# Patient Record
Sex: Female | Born: 1965 | Race: White | Hispanic: No | Marital: Married | Smoking: Never smoker
Health system: Southern US, Community
[De-identification: ages and names within clinical notes are randomized; demographics above are authoritative.]

## PROBLEM LIST (undated history)

## (undated) DIAGNOSIS — Z8619 Personal history of other infectious and parasitic diseases: Secondary | ICD-10-CM

## (undated) DIAGNOSIS — E213 Hyperparathyroidism, unspecified: Secondary | ICD-10-CM

## (undated) DIAGNOSIS — Z9289 Personal history of other medical treatment: Secondary | ICD-10-CM

## (undated) DIAGNOSIS — N200 Calculus of kidney: Secondary | ICD-10-CM

## (undated) DIAGNOSIS — E785 Hyperlipidemia, unspecified: Secondary | ICD-10-CM

## (undated) DIAGNOSIS — T7840XA Allergy, unspecified, initial encounter: Secondary | ICD-10-CM

## (undated) DIAGNOSIS — F329 Major depressive disorder, single episode, unspecified: Secondary | ICD-10-CM

## (undated) DIAGNOSIS — G43909 Migraine, unspecified, not intractable, without status migrainosus: Secondary | ICD-10-CM

## (undated) DIAGNOSIS — N390 Urinary tract infection, site not specified: Secondary | ICD-10-CM

## (undated) DIAGNOSIS — F32A Depression, unspecified: Secondary | ICD-10-CM

## (undated) HISTORY — PX: TONSILLECTOMY: SUR1361

## (undated) HISTORY — PX: KIDNEY STONE SURGERY: SHX686

## (undated) HISTORY — DX: Calculus of kidney: N20.0

## (undated) HISTORY — PX: OTHER SURGICAL HISTORY: SHX169

## (undated) HISTORY — DX: Major depressive disorder, single episode, unspecified: F32.9

## (undated) HISTORY — DX: Personal history of other medical treatment: Z92.89

## (undated) HISTORY — PX: CHOLECYSTECTOMY: SHX55

## (undated) HISTORY — DX: Hyperlipidemia, unspecified: E78.5

## (undated) HISTORY — DX: Hyperparathyroidism, unspecified: E21.3

## (undated) HISTORY — PX: ABDOMINAL HYSTERECTOMY: SHX81

## (undated) HISTORY — PX: MENISCUS REPAIR: SHX5179

## (undated) HISTORY — DX: Allergy, unspecified, initial encounter: T78.40XA

## (undated) HISTORY — DX: Migraine, unspecified, not intractable, without status migrainosus: G43.909

## (undated) HISTORY — DX: Depression, unspecified: F32.A

## (undated) HISTORY — PX: PARATHYROIDECTOMY: SHX19

## (undated) HISTORY — DX: Personal history of other infectious and parasitic diseases: Z86.19

## (undated) HISTORY — DX: Urinary tract infection, site not specified: N39.0

---

## 2007-03-16 ENCOUNTER — Ambulatory Visit: Payer: Self-pay | Admitting: Family Medicine

## 2007-03-19 ENCOUNTER — Emergency Department: Payer: Self-pay | Admitting: Emergency Medicine

## 2007-05-20 ENCOUNTER — Ambulatory Visit: Payer: Self-pay | Admitting: Obstetrics and Gynecology

## 2008-01-13 ENCOUNTER — Ambulatory Visit: Payer: Self-pay | Admitting: Obstetrics and Gynecology

## 2008-01-18 ENCOUNTER — Inpatient Hospital Stay: Payer: Self-pay | Admitting: Obstetrics and Gynecology

## 2008-01-24 ENCOUNTER — Other Ambulatory Visit: Payer: Self-pay

## 2008-01-24 ENCOUNTER — Emergency Department: Payer: Self-pay | Admitting: Emergency Medicine

## 2008-02-03 ENCOUNTER — Inpatient Hospital Stay: Payer: Self-pay | Admitting: Obstetrics and Gynecology

## 2009-11-22 ENCOUNTER — Ambulatory Visit: Payer: Self-pay | Admitting: Sports Medicine

## 2013-10-11 ENCOUNTER — Emergency Department: Payer: Self-pay | Admitting: Emergency Medicine

## 2013-10-11 LAB — URINALYSIS, COMPLETE
Bilirubin,UR: NEGATIVE
GLUCOSE, UR: NEGATIVE mg/dL (ref 0–75)
Leukocyte Esterase: NEGATIVE
Nitrite: NEGATIVE
Ph: 7 (ref 4.5–8.0)
Protein: NEGATIVE
RBC,UR: 537 /HPF (ref 0–5)
Specific Gravity: 1.016 (ref 1.003–1.030)
Squamous Epithelial: <1
WBC UR: 4 /HPF (ref 0–5)

## 2013-10-11 LAB — CBC WITH DIFFERENTIAL/PLATELET
BASOS ABS: 0.1 10*3/uL (ref 0.0–0.1)
Basophil %: 0.4 %
Eosinophil #: 0.1 10*3/uL (ref 0.0–0.7)
Eosinophil %: 0.7 %
HCT: 39.2 % (ref 35.0–47.0)
HGB: 13.1 g/dL (ref 12.0–16.0)
LYMPHS PCT: 14.3 %
Lymphocyte #: 2 10*3/uL (ref 1.0–3.6)
MCH: 30.4 pg (ref 26.0–34.0)
MCHC: 33.3 g/dL (ref 32.0–36.0)
MCV: 91 fL (ref 80–100)
MONO ABS: 0.7 x10 3/mm (ref 0.2–0.9)
MONOS PCT: 5 %
Neutrophil #: 11.3 10*3/uL — ABNORMAL HIGH (ref 1.4–6.5)
Neutrophil %: 79.6 %
PLATELETS: 232 10*3/uL (ref 150–440)
RBC: 4.3 10*6/uL (ref 3.80–5.20)
RDW: 13.5 % (ref 11.5–14.5)
WBC: 14.2 10*3/uL — ABNORMAL HIGH (ref 3.6–11.0)

## 2013-10-11 LAB — COMPREHENSIVE METABOLIC PANEL
ALK PHOS: 111 U/L
Albumin: 4.3 g/dL (ref 3.4–5.0)
Anion Gap: 5 — ABNORMAL LOW (ref 7–16)
BUN: 32 mg/dL — AB (ref 7–18)
Bilirubin,Total: 0.4 mg/dL (ref 0.2–1.0)
CALCIUM: 9.7 mg/dL (ref 8.5–10.1)
CHLORIDE: 105 mmol/L (ref 98–107)
CO2: 30 mmol/L (ref 21–32)
Creatinine: 1.2 mg/dL (ref 0.60–1.30)
EGFR (Non-African Amer.): 54 — ABNORMAL LOW
Glucose: 143 mg/dL — ABNORMAL HIGH (ref 65–99)
OSMOLALITY: 289 (ref 275–301)
Potassium: 4.1 mmol/L (ref 3.5–5.1)
SGOT(AST): 29 U/L (ref 15–37)
SGPT (ALT): 31 U/L (ref 12–78)
Sodium: 140 mmol/L (ref 136–145)
TOTAL PROTEIN: 7.3 g/dL (ref 6.4–8.2)

## 2013-10-18 ENCOUNTER — Ambulatory Visit: Payer: Self-pay | Admitting: Urology

## 2013-11-25 ENCOUNTER — Other Ambulatory Visit: Payer: Self-pay | Admitting: Family Medicine

## 2013-11-25 DIAGNOSIS — N83209 Unspecified ovarian cyst, unspecified side: Secondary | ICD-10-CM

## 2013-12-08 ENCOUNTER — Other Ambulatory Visit: Payer: Self-pay | Admitting: Family Medicine

## 2013-12-08 DIAGNOSIS — N83209 Unspecified ovarian cyst, unspecified side: Secondary | ICD-10-CM

## 2013-12-29 ENCOUNTER — Other Ambulatory Visit: Payer: Self-pay | Admitting: Family Medicine

## 2013-12-29 ENCOUNTER — Ambulatory Visit
Admission: RE | Admit: 2013-12-29 | Discharge: 2013-12-29 | Disposition: A | Discharge status: Home / Self Care | Payer: 59 | Source: Ambulatory Visit | Attending: Family Medicine | Admitting: Family Medicine

## 2013-12-29 DIAGNOSIS — N6310 Unspecified lump in the right breast, unspecified quadrant: Principal | ICD-10-CM

## 2013-12-29 DIAGNOSIS — N83209 Unspecified ovarian cyst, unspecified side: Secondary | ICD-10-CM

## 2013-12-29 DIAGNOSIS — N6315 Unspecified lump in the right breast, overlapping quadrants: Secondary | ICD-10-CM

## 2014-01-07 ENCOUNTER — Other Ambulatory Visit: Payer: Self-pay

## 2014-03-10 ENCOUNTER — Other Ambulatory Visit: Payer: Self-pay | Admitting: Family Medicine

## 2014-03-10 DIAGNOSIS — N83209 Unspecified ovarian cyst, unspecified side: Secondary | ICD-10-CM

## 2014-07-07 ENCOUNTER — Emergency Department: Payer: Self-pay | Admitting: Emergency Medicine

## 2014-09-12 ENCOUNTER — Ambulatory Visit
Admit: 2014-09-12 | Disposition: A | Discharge status: Home / Self Care | Payer: Self-pay | Attending: Specialist | Admitting: Specialist

## 2014-10-09 NOTE — Op Note (Signed)
PATIENT NAME:  [name], Melanie Ballard MR#:  [id number] DATE OF BIRTH:  [birth year]  DATE OF PROCEDURE:  09/12/2014  PREOPERATIVE DIAGNOSIS: Displaced bucket handle tear right medial meniscus.   POSTOPERATIVE DIAGNOSIS: Displaced bucket handle tear right medial meniscus.   OPERATION: Arthroscopic partial right medial meniscectomy.   SURGEON: Valinda HoarHoward E. Aneudy Champlain, MD.   ANESTHESIA: General LMA.   COMPLICATIONS: None.   DRAINS: None.   ESTIMATED BLOOD LOSS: Minimal.   REPLACEMENT: None.   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 The anterior and posterior cruciates were intact. The lateral compartment was normal. The patellofemoral articulation was normal. There was moderate synovitis. There were no loose bodies.   OPERATIVE PROCEDURE: The patient was brought to the operating room where she underwent satisfactory general LMA anesthesia in the supine position. Arthroscopic surgery was carried out through standard portals. The above findings were encountered. A medial portal was made and a probe inserted, and the bucket handle tear reduced. It was extremely ragged and complex and did not appear to be at all repairable. The handle was divided in 2 and a motorized resector used to remove the anterior portion and the posterior section. The ArthroCare wand was used to assist in this. Basket forceps were used as well. The torn portion of the meniscus was removed in its entirety. This left a moderate rim. This was ragged and somewhat avascular-looking. It was trimmed up with the motorized resector to smooth it off. Articular surface of the femur had some dents in it from the meniscus anteriorly. Otherwise there was minimal degenerative change. After all debridement was carried out the joint was thoroughly irrigated free of debris and instruments removed. Stab wounds were closed with 3-0 nylon suture and 0.5% Marcaine  with epinephrine and morphine were placed in the joint. Dry sterile dressing was applied. Tourniquet was not used. The patient was awakened and taken to recovery in good condition.    ____________________________ Valinda HoarHoward E. Brenda Cowher, MD hem:bu Ballard: 09/12/2014 15:52:03 ET T: 09/12/2014 17:56:38 ET JOB#: [id number]  cc: Valinda HoarHoward E. Veronica Fretz, MD, <Dictator> Valinda HoarHOWARD E Lavana Huckeba MD ELECTRONICALLY SIGNED 09/14/2014 9:52

## 2015-02-02 ENCOUNTER — Other Ambulatory Visit: Payer: Self-pay | Admitting: Family Medicine

## 2015-02-02 DIAGNOSIS — N6310 Unspecified lump in the right breast, unspecified quadrant: Principal | ICD-10-CM

## 2015-02-02 DIAGNOSIS — N6315 Unspecified lump in the right breast, overlapping quadrants: Secondary | ICD-10-CM

## 2015-02-08 ENCOUNTER — Ambulatory Visit
Admission: RE | Admit: 2015-02-08 | Discharge: 2015-02-08 | Disposition: A | Discharge status: Home / Self Care | Payer: 59 | Source: Ambulatory Visit | Attending: Family Medicine | Admitting: Family Medicine

## 2015-02-08 ENCOUNTER — Other Ambulatory Visit: Payer: Self-pay | Admitting: Family Medicine

## 2015-02-08 DIAGNOSIS — N6310 Unspecified lump in the right breast, unspecified quadrant: Principal | ICD-10-CM

## 2015-02-08 DIAGNOSIS — N6315 Unspecified lump in the right breast, overlapping quadrants: Secondary | ICD-10-CM

## 2016-07-08 ENCOUNTER — Other Ambulatory Visit: Payer: Self-pay | Admitting: Family Medicine

## 2016-07-08 DIAGNOSIS — Z1231 Encounter for screening mammogram for malignant neoplasm of breast: Secondary | ICD-10-CM

## 2016-07-18 ENCOUNTER — Ambulatory Visit
Admission: RE | Admit: 2016-07-18 | Discharge: 2016-07-18 | Disposition: A | Discharge status: Home / Self Care | Payer: 59 | Source: Ambulatory Visit | Attending: Family Medicine | Admitting: Family Medicine

## 2016-07-18 DIAGNOSIS — Z1231 Encounter for screening mammogram for malignant neoplasm of breast: Secondary | ICD-10-CM

## 2016-07-29 LAB — HM COLONOSCOPY

## 2016-09-06 ENCOUNTER — Encounter: Payer: Self-pay | Admitting: Emergency Medicine

## 2016-09-06 ENCOUNTER — Emergency Department
Admission: EM | Admit: 2016-09-06 | Discharge: 2016-09-06 | Disposition: A | Discharge status: Home / Self Care | Payer: 59 | Attending: Emergency Medicine | Admitting: Emergency Medicine

## 2016-09-06 ENCOUNTER — Emergency Department: Payer: 59

## 2016-09-06 DIAGNOSIS — R109 Unspecified abdominal pain: Secondary | ICD-10-CM | POA: Diagnosis present

## 2016-09-06 DIAGNOSIS — N23 Unspecified renal colic: Secondary | ICD-10-CM | POA: Diagnosis not present

## 2016-09-06 DIAGNOSIS — R52 Pain, unspecified: Secondary | ICD-10-CM

## 2016-09-06 LAB — URINALYSIS, COMPLETE (UACMP) WITH MICROSCOPIC
BILIRUBIN URINE: NEGATIVE
GLUCOSE, UA: NEGATIVE mg/dL
Ketones, ur: 5 mg/dL — AB
LEUKOCYTES UA: NEGATIVE
NITRITE: NEGATIVE
PH: 6 (ref 5.0–8.0)
Protein, ur: 30 mg/dL — AB
SPECIFIC GRAVITY, URINE: 1.019 (ref 1.005–1.030)
Squamous Epithelial / LPF: NONE SEEN

## 2016-09-06 LAB — CBC WITH DIFFERENTIAL/PLATELET
Basophils Absolute: 0 10*3/uL (ref 0–0.1)
Basophils Relative: 0 %
EOS ABS: 0 10*3/uL (ref 0–0.7)
Eosinophils Relative: 0 %
HCT: 39.6 % (ref 35.0–47.0)
HEMOGLOBIN: 14 g/dL (ref 12.0–16.0)
LYMPHS ABS: 1.5 10*3/uL (ref 1.0–3.6)
Lymphocytes Relative: 13 %
MCH: 31.2 pg (ref 26.0–34.0)
MCHC: 35.3 g/dL (ref 32.0–36.0)
MCV: 88.5 fL (ref 80.0–100.0)
MONO ABS: 0.9 10*3/uL (ref 0.2–0.9)
MONOS PCT: 8 %
NEUTROS PCT: 79 %
Neutro Abs: 9 10*3/uL — ABNORMAL HIGH (ref 1.4–6.5)
Platelets: 254 10*3/uL (ref 150–440)
RBC: 4.48 MIL/uL (ref 3.80–5.20)
RDW: 13.4 % (ref 11.5–14.5)
WBC: 11.4 10*3/uL — ABNORMAL HIGH (ref 3.6–11.0)

## 2016-09-06 LAB — COMPREHENSIVE METABOLIC PANEL
ALT: 29 U/L (ref 14–54)
ANION GAP: 10 (ref 5–15)
AST: 35 U/L (ref 15–41)
Albumin: 4.9 g/dL (ref 3.5–5.0)
Alkaline Phosphatase: 137 U/L — ABNORMAL HIGH (ref 38–126)
BILIRUBIN TOTAL: 1 mg/dL (ref 0.3–1.2)
BUN: 29 mg/dL — ABNORMAL HIGH (ref 6–20)
CHLORIDE: 100 mmol/L — AB (ref 101–111)
CO2: 28 mmol/L (ref 22–32)
Calcium: 10.9 mg/dL — ABNORMAL HIGH (ref 8.9–10.3)
Creatinine, Ser: 1.5 mg/dL — ABNORMAL HIGH (ref 0.44–1.00)
GFR calc Af Amer: 46 mL/min — ABNORMAL LOW (ref >60)
GFR, EST NON AFRICAN AMERICAN: 40 mL/min — AB (ref >60)
Glucose, Bld: 124 mg/dL — ABNORMAL HIGH (ref 65–99)
POTASSIUM: 4.2 mmol/L (ref 3.5–5.1)
Sodium: 138 mmol/L (ref 135–145)
TOTAL PROTEIN: 8.1 g/dL (ref 6.5–8.1)

## 2016-09-06 LAB — LIPASE, BLOOD: LIPASE: 11 U/L (ref 11–51)

## 2016-09-06 MED ORDER — MORPHINE SULFATE (PF) 4 MG/ML IV SOLN
4.0000 mg | Freq: Once | INTRAVENOUS | Status: AC
  Administered 2016-09-06: 4 mg via INTRAVENOUS
  Filled 2016-09-06: qty 1

## 2016-09-06 MED ORDER — HYDROMORPHONE HCL 1 MG/ML IJ SOLN
0.5000 mg | Freq: Once | INTRAMUSCULAR | Status: AC
  Administered 2016-09-06: 0.5 mg via INTRAVENOUS
  Filled 2016-09-06: qty 1

## 2016-09-06 MED ORDER — CIPROFLOXACIN HCL 500 MG PO TABS: 500.0000 mg | ORAL_TABLET | Freq: Two times a day (BID) | ORAL | 0 refills | Status: DC

## 2016-09-06 MED ORDER — OXYCODONE-ACETAMINOPHEN 5-325 MG PO TABS: 1.0000 | ORAL_TABLET | Freq: Four times a day (QID) | ORAL | 0 refills | Status: DC | PRN

## 2016-09-06 MED ORDER — ONDANSETRON HCL 4 MG/2ML IJ SOLN
4.0000 mg | Freq: Once | INTRAMUSCULAR | Status: AC
  Administered 2016-09-06: 4 mg via INTRAVENOUS
  Filled 2016-09-06: qty 2

## 2016-09-06 NOTE — ED Provider Notes (Signed)
[facility] Emergency Department Provider Note   ____________________________________________   First MD Initiated Contact with Patient 09/06/16 [phone]     (approximate)  I have reviewed the triage vital signs and the nursing notes.   HISTORY  Chief Complaint Flank Pain and Abdominal Pain    HPI Melanie Ballard is a 51 y.o. female who reports she was at her daughter's house yesterday and had kind of sudden onset of left flank pain radiating down toward the left lower quadrant of her abdomen. It was severe and she was unable to lay down. She's had some nausea and vomiting but no diarrhea. She's had no increased pain with deep breathing or the bumps in the road. It is no dysuria.No fever.no diarrhea.  History reviewed. No pertinent past medical history.  There are no active problems to display for this patient.   Past Surgical History:  Procedure Laterality Date  . ABDOMINAL HYSTERECTOMY    . CESAREAN SECTION    . CHOLECYSTECTOMY    . TONSILLECTOMY      Prior to Admission medications   Medication Sig Start Date End Date Taking? Authorizing Provider  ciprofloxacin (CIPRO) 500 MG tablet Take 1 tablet (500 mg total) by mouth 2 (two) times daily. 09/06/16 09/16/16  Arnaldo Natal, MD  oxyCODONE-acetaminophen (ROXICET) 5-325 MG tablet Take 1 tablet by mouth every 6 (six) hours as needed. 09/06/16 09/06/17  Arnaldo Natal, MD    Allergies Penicillins  No family history on file.  Social History Social History  Substance Use Topics  . Smoking status: Never Smoker  . Smokeless tobacco: Not on file  . Alcohol use Not on file    Review of Systems Constitutional: No fever/chills Eyes: No visual changes. ENT: No sore throat. Cardiovascular: Denies chest pain. Respiratory: Denies shortness of breath. Gastrointestinal:ee history of present illness Genitourinary: Negative for dysuria. Musculoskeletal: Negative for back pain. Skin: Negative for  rash. Neurological: Negative for headaches, focal weakness or numbness.  10-point ROS otherwise negative.  ____________________________________________   PHYSICAL EXAM:  VITAL SIGNS: ED Triage Vitals  Enc Vitals Group     BP 09/06/16 0909 (!) 151/100     Pulse Rate 09/06/16 0909 95     Resp 09/06/16 0909 18     Temp 09/06/16 0909 98.4 F (36.9 C)     Temp Source 09/06/16 0909 Oral     SpO2 09/06/16 0909 100 %     Weight 09/06/16 0909 170 lb (77.1 kg)     Height 09/06/16 0909  (1.626 m)     Head Circumference --      Peak Flow --      Pain Score 09/06/16 0907 6     Pain Loc --      Pain Edu? --      Excl. in GC? --    Constitutional: Alert and oriented. Well appearing and in no acute distress. Eyes: Conjunctivae are normal. PERRL. EOMI. Head: Atraumatic. Nose: No congestion/rhinnorhea. Mouth/Throat: Mucous membranes are moist.  Oropharynx non-erythematous. Neck: No stridor.  Cardiovascular: Normal rate, regular rhythm. Grossly normal heart sounds.  Good peripheral circulation. Respiratory: Normal respiratory effort.  No retractions. Lungs CTAB. Gastrointestinal: Soft tender to deep palpation left lower quadrant. No distention. No abdominal bruits. No CVA tenderness! Musculoskeletal: No lower extremity tenderness nor edema.  No joint effusions. Neurologic:  Normal speech and language. No gross focal neurologic deficits are appreciated. No gait instability. Skin:  Skin is warm, dry and intact. No rash  noted.   ____________________________________________   LABS (all labs ordered are listed, but only abnormal results are displayed)  Labs Reviewed  COMPREHENSIVE METABOLIC PANEL - Abnormal; Notable for the following:       Result Value   Chloride 100 (*)    Glucose, Bld 124 (*)    BUN 29 (*)    Creatinine, Ser 1.50 (*)    Calcium 10.9 (*)    Alkaline Phosphatase 137 (*)    GFR calc non Af Amer 40 (*)    GFR calc Af Amer 46 (*)    All other components within  normal limits  URINALYSIS, COMPLETE (UACMP) WITH MICROSCOPIC - Abnormal; Notable for the following:    Color, Urine YELLOW (*)    APPearance CLOUDY (*)    Hgb urine dipstick LARGE (*)    Ketones, ur 5 (*)    Protein, ur 30 (*)    Bacteria, UA RARE (*)    All other components within normal limits  CBC WITH DIFFERENTIAL/PLATELET - Abnormal; Notable for the following:    WBC 11.4 (*)    Neutro Abs 9.0 (*)    All other components within normal limits  LIPASE, BLOOD   ____________________________________________  EKG   ____________________________________________  RADIOLOGY   ____________________________________________   PROCEDURES  Procedure(s) performed:  Procedures  Critical Care performed:   ____________________________________________   INITIAL IMPRESSION / ASSESSMENT AND PLAN / ED COURSE  Pertinent labs & imaging results that were available during my care of the patient were reviewed by me and considered in my medical decision making (see chart for details).        ____________________________________________   FINAL CLINICAL IMPRESSION(S) / ED DIAGNOSES  Final diagnoses:  Pain  Renal colic      NEW MEDICATIONS STARTED DURING THIS VISIT:  Discharge Medication List as of 09/06/2016 11:33 AM    START taking these medications   Details  ciprofloxacin (CIPRO) 500 MG tablet Take 1 tablet (500 mg total) by mouth 2 (two) times daily., Starting Fri 09/06/2016, Until Mon 09/16/2016, Print    oxyCODONE-acetaminophen (ROXICET) 5-325 MG tablet Take 1 tablet by mouth every 6 (six) hours as needed., Starting Fri 09/06/2016, Until Sat 09/06/2017, Print         Note:  This document was prepared using Dragon voice recognition software and may include unintentional dictation errors.    Arnaldo Natal, MD 09/06/16 [phone]

## 2016-09-06 NOTE — ED Triage Notes (Signed)
L flank pain since yesterday, no history of kidney stones or kidney infection.

## 2016-09-06 NOTE — ED Notes (Signed)
Patient returned from CT

## 2016-09-06 NOTE — ED Notes (Signed)
Patient transported to CT 

## 2016-09-06 NOTE — Discharge Instructions (Signed)
His return for worse pain fever vomiting or feeling sicker. Please follow-up with urologist. Take the Cipro one pill twice a day. This is an antibiotic and will help to keep you from getting any infection. There are some white blood cells and bacteria in the urine. Take the Percocet 1 or 2 pills 4 times a day as needed for pain. Please understand the stone is very large and may not pass. The urologist will help with this.

## 2016-09-10 NOTE — Progress Notes (Signed)
09/11/2016 11:51 AM   Prescott Parma Feb 21, 1966 [id number]  Referring provider: Gweneth Dimitri, MD [address] [place], [place] [address]  Chief Complaint  Patient presents with  . New Patient (Initial Visit)    kidney stone referred by ER    HPI: Patient is a 51 year old Caucasian female who presents/is referred by [facility] ED for nephrolithiasis.  Patient states the onset of the pain was one weeks ago.   It was sharp.  It lasted for several hours.  The pain was located in the left flank and radiated to the left lower abdomen.  The pain was a 10/10.  Nothing made the pain better.   Nothing made the pain worse.  She did have or did not have gross hematuria, fevers or chills, she has had nausea and vomiting.  In the ED, she received morphine, dilaudid and Zofran.  Her UA demonstrated TNTC RBC's and 6-10 WBC's.    Her serum creatinine was 1.50, her prior serum creatinine was 1.20.  Her WBC count was 11.4.    CT Renal stone study performed on 09/06/2016 noted a 7 mm mid left ureteral calculus is associated with left hydronephrosis.  Right nephrolithiasis.  Degenerative disc disease at L4-5.  Chronic changes.  I have independently reviewed the films.    Today, she is still having 10/10 pain.  UA today is unremarkable.    She does not have a prior history of stones.         PMH: Past Medical History:  Diagnosis Date  . Depression   . Kidney stone     Surgical History: Past Surgical History:  Procedure Laterality Date  . ABDOMINAL HYSTERECTOMY    . CESAREAN SECTION    . CHOLECYSTECTOMY    . TONSILLECTOMY      Home Medications:  Allergies as of 09/11/2016      Reactions   Penicillins Shortness Of Breath      Medication List       Accurate as of 09/11/16 11:51 AM. Always use your most recent med list.          buPROPion 200 MG 12 hr tablet Commonly known as:  WELLBUTRIN SR Take 200 mg by mouth.   ciprofloxacin 500 MG tablet Commonly known as:   CIPRO Take 1 tablet (500 mg total) by mouth 2 (two) times daily.   DULOXETINE HCL PO Take 90 mg by mouth.   EQL VITAMIN D3 GUMMIES PO Take by mouth.   oxyCODONE-acetaminophen 5-325 MG tablet Commonly known as:  ROXICET Take 1 tablet by mouth every 6 (six) hours as needed.   tamsulosin 0.4 MG Caps capsule Commonly known as:  FLOMAX Take 1 capsule (0.4 mg total) by mouth daily.   zolpidem 10 MG tablet Commonly known as:  AMBIEN Take 10 mg by mouth.       Allergies:  Allergies  Allergen Reactions  . Penicillins Shortness Of Breath    Family History: Family History  Problem Relation Age of Onset  . Prostate cancer Neg Hx   . Kidney cancer Neg Hx   . Bladder Cancer Neg Hx     Social History:  reports that she has never smoked. She has never used smokeless tobacco. She reports that she drinks alcohol. She reports that she does not use drugs.  ROS: UROLOGY Frequent Urination?: Yes Hard to postpone urination?: Yes Burning/pain with urination?: No Get up at night to urinate?: Yes Leakage of urine?: Yes Urine stream starts and  stops?: No Trouble starting stream?: No Do you have to strain to urinate?: No Blood in urine?: No Urinary tract infection?: No Sexually transmitted disease?: No Injury to kidneys or bladder?: No Painful intercourse?: No Weak stream?: No Currently pregnant?: No Vaginal bleeding?: No Last menstrual period?: n  Gastrointestinal Nausea?: Yes Vomiting?: Yes Indigestion/heartburn?: Yes Diarrhea?: No Constipation?: No  Constitutional Fever: No Night sweats?: Yes Weight loss?: No Fatigue?: Yes  Skin Skin rash/lesions?: No Itching?: No  Eyes Blurred vision?: No Double vision?: No  Ears/Nose/Throat Sore throat?: No Sinus problems?: No  Hematologic/Lymphatic Swollen glands?: No Easy bruising?: No  Cardiovascular Leg swelling?: No Chest pain?: No  Respiratory Cough?: No Shortness of breath?: No  Endocrine Excessive  thirst?: No  Musculoskeletal Back pain?: Yes Joint pain?: No  Neurological Headaches?: No Dizziness?: No  Psychologic Depression?: No Anxiety?: No  Physical Exam: BP (!) 152/83   Pulse 78   Ht  (1.626 m)   Wt 168 lb 9.6 oz (76.5 kg)   BMI 28.94 kg/m   Constitutional: Well nourished. Alert and oriented, No acute distress. HEENT: [place] AT, moist mucus membranes. Trachea midline, no masses. Cardiovascular: No clubbing, cyanosis, or edema. Respiratory: Normal respiratory effort, no increased work of breathing. GI: Abdomen is soft, non tender, non distended, no abdominal masses. Liver and spleen not palpable.  No hernias appreciated.  Stool sample for occult testing is not indicated.   GU: Left CVA tenderness.  No bladder fullness or masses.   Skin: No rashes, bruises or suspicious lesions. Lymph: No cervical or inguinal adenopathy. Neurologic: Grossly intact, no focal deficits, moving all 4 extremities. Psychiatric: Normal mood and affect.  Laboratory Data: Lab Results  Component Value Date   WBC 11.4 (H) 09/06/2016   HGB 14.0 09/06/2016   HCT 39.6 09/06/2016   MCV 88.5 09/06/2016   PLT 254 09/06/2016    Lab Results  Component Value Date   CREATININE 1.50 (H) 09/06/2016     Lab Results  Component Value Date   AST 35 09/06/2016   Lab Results  Component Value Date   ALT 29 09/06/2016     Urinalysis Unremarkable.  See EPIC.    Pertinent Imaging: CLINICAL DATA:  Left abdominal pain  EXAM: CT ABDOMEN AND PELVIS WITHOUT CONTRAST  TECHNIQUE: Multidetector CT imaging of the abdomen and pelvis was performed following the standard protocol without IV contrast.  COMPARISON:  10/11/2013  FINDINGS: Lower chest: No acute abnormality.  Hepatobiliary: Unremarkable liver.  Postcholecystectomy.  Pancreas: Unremarkable  Spleen: Calcified granulomata.  Adrenals/Urinary Tract: Moderate to severe left hydronephrosis. 7 mm mid left ureteral  calculus. Tiny calculus in the upper pole of the right kidney.  Stomach/Bowel: Stomach is unremarkable. Normal appendix. No evidence of small-bowel obstruction.  Vascular/Lymphatic: No abnormal adenopathy. No evidence of aortic aneurysm. Mild atherosclerotic calcifications of the aorta and iliac arteries.  Reproductive: Uterus is absent. Left adnexa is unremarkable. Cystic abnormality in the right adnexa previously measured 5.6 cm, and state measures 3.7 cm. Regression supports benign etiology.  Other: No free-fluid.  Pelvic phleboliths are noted.  Musculoskeletal: No vertebral compression deformity. Broad-based disc herniation at L4-5 possibly causes spinal stenosis.  IMPRESSION: 7 mm mid left ureteral calculus is associated with left hydronephrosis.  Right nephrolithiasis.  Degenerative disc disease at L4-5.  Chronic changes.   Electronically Signed   By: Jolaine Click M.D.   On: 09/06/2016 09:56  Assessment & Plan:    Patient with an obstructing 8 mm left mid ureteral stone will undergo  left ureteroscopy with laser lithotripsy and ureteral stent placement for definitive treatment.    1. Left ureteral stone  - discussed treatment approaches to renal stones including observation, medical expulsive therapy, ureteroscopic stone extraction and shockwave lithotripsy - discussed the relative merits of the techniques including risks, benefits, and predicted efficacy with each techniques - she would like to pursue URS/LL/ureteral stent placement  - schedule left ureteroscopy with laser lithotripsy and ureteral stent placement  - explained to the patient how the procedure is performed and the risks involved  - informed patient that they will have a stent placed during the procedure and will remain in place after the procedure for a short time.   - stent may be removed in the office with a cystoscope or patient may be instructed to remove the stent themselves by the  string  - described "stent pain" as feelings of needing to urinate/overactive bladder and a warm, tingling sensation to intense pain in the affected flank  - residual stones within the kidney or ureter may be present after the procedure and may need to have these addressed at a different encounter  - injury to the ureter is the most common intra-operative risk, it may result in an open procedure to correct the defect  - infection and bleeding are also risks  - explained the risks of general anesthesia, such as: MI, CVA, paralysis, coma and/or death.  - advised to contact our office or seek treatment in the ED if becomes febrile or pain/ vomiting are difficult control in order to arrange for emergent/urgent intervention  2. Left hydronephrosis  - obtain RUS to ensure the hydronephrosis has resolved once she has recovered from procedure  3. Microscopic hematuria  - UA today unremarkable  - continue to monitor the patient's UA after the treatment/passage of the stone to ensure the hematuria has resolved  - if hematuria persists, we will pursue a hematuria workup with CT Urogram and cystoscopy if appropriate.  Return for left URS/LL/ureteral stent placement.  These notes generated with voice recognition software. I apologize for typographical errors.  Michiel Cowboy, PA-C  [facility] [address], [address] [place], [place] [address] [phone]

## 2016-09-11 ENCOUNTER — Ambulatory Visit: Payer: 59 | Admitting: Urology

## 2016-09-11 ENCOUNTER — Encounter: Payer: Self-pay | Admitting: Urology

## 2016-09-11 ENCOUNTER — Other Ambulatory Visit: Payer: Self-pay | Admitting: Radiology

## 2016-09-11 VITALS — BP 152/83 | HR 78 | Ht 64.0 in | Wt 168.6 lb

## 2016-09-11 DIAGNOSIS — N132 Hydronephrosis with renal and ureteral calculous obstruction: Secondary | ICD-10-CM | POA: Diagnosis not present

## 2016-09-11 DIAGNOSIS — N201 Calculus of ureter: Secondary | ICD-10-CM

## 2016-09-11 DIAGNOSIS — R3129 Other microscopic hematuria: Secondary | ICD-10-CM

## 2016-09-11 LAB — URINALYSIS, COMPLETE
BILIRUBIN UA: NEGATIVE
GLUCOSE, UA: NEGATIVE
KETONES UA: NEGATIVE
Nitrite, UA: NEGATIVE
PROTEIN UA: NEGATIVE
SPEC GRAV UA: 1.025 (ref 1.005–1.030)
UUROB: 0.2 mg/dL (ref 0.2–1.0)
pH, UA: 5.5 (ref 5.0–7.5)

## 2016-09-11 LAB — MICROSCOPIC EXAMINATION

## 2016-09-11 MED ORDER — CIPROFLOXACIN HCL 500 MG PO TABS: 500.0000 mg | ORAL_TABLET | Freq: Two times a day (BID) | ORAL | 0 refills | Status: DC

## 2016-09-11 MED ORDER — TAMSULOSIN HCL 0.4 MG PO CAPS: 0.4000 mg | ORAL_CAPSULE | Freq: Every day | ORAL | 0 refills | Status: DC

## 2016-09-11 MED ORDER — OXYCODONE-ACETAMINOPHEN 5-325 MG PO TABS
1.0000 | ORAL_TABLET | Freq: Four times a day (QID) | ORAL | 0 refills | Status: DC | PRN
Start: 2016-09-11 — End: 2016-09-12

## 2016-09-12 ENCOUNTER — Ambulatory Visit: Payer: 59 | Admitting: Anesthesiology

## 2016-09-12 ENCOUNTER — Ambulatory Visit
Admission: RE | Admit: 2016-09-12 | Discharge: 2016-09-12 | Disposition: A | Discharge status: Home / Self Care | Payer: 59 | Source: Ambulatory Visit | Attending: Urology | Admitting: Urology

## 2016-09-12 ENCOUNTER — Encounter
Admission: RE | Disposition: A | Discharge status: Home / Self Care | Payer: Self-pay | Source: Ambulatory Visit | Attending: Urology

## 2016-09-12 ENCOUNTER — Encounter: Payer: Self-pay | Admitting: Anesthesiology

## 2016-09-12 DIAGNOSIS — D7389 Other diseases of spleen: Secondary | ICD-10-CM | POA: Diagnosis not present

## 2016-09-12 DIAGNOSIS — N201 Calculus of ureter: Secondary | ICD-10-CM

## 2016-09-12 DIAGNOSIS — M5136 Other intervertebral disc degeneration, lumbar region: Secondary | ICD-10-CM | POA: Insufficient documentation

## 2016-09-12 DIAGNOSIS — Z90710 Acquired absence of both cervix and uterus: Secondary | ICD-10-CM | POA: Insufficient documentation

## 2016-09-12 DIAGNOSIS — F329 Major depressive disorder, single episode, unspecified: Secondary | ICD-10-CM | POA: Diagnosis not present

## 2016-09-12 DIAGNOSIS — Z880 Allergy status to penicillin: Secondary | ICD-10-CM | POA: Diagnosis not present

## 2016-09-12 DIAGNOSIS — N132 Hydronephrosis with renal and ureteral calculous obstruction: Secondary | ICD-10-CM | POA: Diagnosis present

## 2016-09-12 DIAGNOSIS — Z79899 Other long term (current) drug therapy: Secondary | ICD-10-CM | POA: Insufficient documentation

## 2016-09-12 DIAGNOSIS — Z9889 Other specified postprocedural states: Secondary | ICD-10-CM | POA: Diagnosis not present

## 2016-09-12 DIAGNOSIS — Z9049 Acquired absence of other specified parts of digestive tract: Secondary | ICD-10-CM | POA: Diagnosis not present

## 2016-09-12 HISTORY — PX: URETEROSCOPY WITH HOLMIUM LASER LITHOTRIPSY: SHX6645

## 2016-09-12 HISTORY — PX: CYSTOSCOPY WITH STENT PLACEMENT: SHX5790

## 2016-09-12 LAB — BASIC METABOLIC PANEL
BUN / CREAT RATIO: 17 (ref 9–23)
BUN: 21 mg/dL (ref 6–24)
CO2: 26 mmol/L (ref 18–29)
CREATININE: 1.22 mg/dL — AB (ref 0.57–1.00)
Calcium: 10.5 mg/dL — ABNORMAL HIGH (ref 8.7–10.2)
Chloride: 97 mmol/L (ref 96–106)
GFR calc Af Amer: 60 mL/min/{1.73_m2} (ref >59)
GFR, EST NON AFRICAN AMERICAN: 52 mL/min/{1.73_m2} — AB (ref >59)
Glucose: 84 mg/dL (ref 65–99)
Potassium: 4.6 mmol/L (ref 3.5–5.2)
SODIUM: 139 mmol/L (ref 134–144)

## 2016-09-12 LAB — CBC WITH DIFFERENTIAL/PLATELET
BASOS ABS: 0 10*3/uL (ref 0.0–0.2)
BASOS: 0 %
EOS (ABSOLUTE): 0.1 10*3/uL (ref 0.0–0.4)
EOS: 3 %
HEMOGLOBIN: 12.8 g/dL (ref 11.1–15.9)
Hematocrit: 37.6 % (ref 34.0–46.6)
IMMATURE GRANULOCYTES: 0 %
Immature Grans (Abs): 0 10*3/uL (ref 0.0–0.1)
LYMPHS ABS: 1.7 10*3/uL (ref 0.7–3.1)
Lymphs: 31 %
MCH: 30.8 pg (ref 26.6–33.0)
MCHC: 34 g/dL (ref 31.5–35.7)
MCV: 90 fL (ref 79–97)
MONOCYTES: 6 %
MONOS ABS: 0.3 10*3/uL (ref 0.1–0.9)
Neutrophils Absolute: 3.3 10*3/uL (ref 1.4–7.0)
Neutrophils: 60 %
Platelets: 239 10*3/uL (ref 150–379)
RBC: 4.16 x10E6/uL (ref 3.77–5.28)
RDW: 14.2 % (ref 12.3–15.4)
WBC: 5.6 10*3/uL (ref 3.4–10.8)

## 2016-09-12 SURGERY — URETEROSCOPY, WITH LITHOTRIPSY USING HOLMIUM LASER
Anesthesia: General | Laterality: Left

## 2016-09-12 MED ORDER — CIPROFLOXACIN IN D5W 400 MG/200ML IV SOLN
INTRAVENOUS | Status: AC
  Administered 2016-09-12: 400 mg via INTRAVENOUS
  Filled 2016-09-12: qty 200

## 2016-09-12 MED ORDER — ONDANSETRON HCL 4 MG/2ML IJ SOLN
INTRAMUSCULAR | Status: DC | PRN
  Administered 2016-09-12: 4 mg via INTRAVENOUS

## 2016-09-12 MED ORDER — FAMOTIDINE 20 MG PO TABS
ORAL_TABLET | ORAL | Status: AC
  Administered 2016-09-12: 20 mg via ORAL
  Filled 2016-09-12: qty 1

## 2016-09-12 MED ORDER — FENTANYL CITRATE (PF) 100 MCG/2ML IJ SOLN
INTRAMUSCULAR | Status: DC | PRN
  Administered 2016-09-12 (×2): 50 ug via INTRAVENOUS

## 2016-09-12 MED ORDER — LACTATED RINGERS IV SOLN
INTRAVENOUS | Status: DC
  Administered 2016-09-12: 10:00:00 via INTRAVENOUS

## 2016-09-12 MED ORDER — KETOROLAC TROMETHAMINE 30 MG/ML IJ SOLN
INTRAMUSCULAR | Status: AC
  Filled 2016-09-12: qty 1

## 2016-09-12 MED ORDER — CIPROFLOXACIN IN D5W 400 MG/200ML IV SOLN
400.0000 mg | INTRAVENOUS | Status: AC
  Administered 2016-09-12: 400 mg via INTRAVENOUS

## 2016-09-12 MED ORDER — KETOROLAC TROMETHAMINE 30 MG/ML IJ SOLN
INTRAMUSCULAR | Status: DC | PRN
  Administered 2016-09-12: 30 mg via INTRAVENOUS

## 2016-09-12 MED ORDER — GLYCOPYRROLATE 0.2 MG/ML IJ SOLN
INTRAMUSCULAR | Status: DC | PRN
  Administered 2016-09-12: 0.2 mg via INTRAVENOUS

## 2016-09-12 MED ORDER — OXYCODONE-ACETAMINOPHEN 5-325 MG PO TABS
1.0000 | ORAL_TABLET | Freq: Four times a day (QID) | ORAL | Status: DC | PRN
  Administered 2016-09-12: 1 via ORAL

## 2016-09-12 MED ORDER — LIDOCAINE HCL (CARDIAC) 20 MG/ML IV SOLN
INTRAVENOUS | Status: DC | PRN
  Administered 2016-09-12: 50 mg via INTRAVENOUS

## 2016-09-12 MED ORDER — FENTANYL CITRATE (PF) 100 MCG/2ML IJ SOLN
INTRAMUSCULAR | Status: AC
  Filled 2016-09-12: qty 2

## 2016-09-12 MED ORDER — OXYCODONE-ACETAMINOPHEN 5-325 MG PO TABS: 1.0000 | ORAL_TABLET | Freq: Four times a day (QID) | ORAL | 0 refills | Status: AC | PRN

## 2016-09-12 MED ORDER — ACETAMINOPHEN 10 MG/ML IV SOLN
INTRAVENOUS | Status: DC | PRN
Start: 2016-09-12 — End: 2016-09-12
  Administered 2016-09-12: 1000 mg via INTRAVENOUS

## 2016-09-12 MED ORDER — FENTANYL CITRATE (PF) 100 MCG/2ML IJ SOLN: 25.0000 ug | INTRAMUSCULAR | Status: DC | PRN

## 2016-09-12 MED ORDER — OXYCODONE-ACETAMINOPHEN 5-325 MG PO TABS
ORAL_TABLET | ORAL | Status: AC
  Administered 2016-09-12: 1 via ORAL
  Filled 2016-09-12: qty 1

## 2016-09-12 MED ORDER — FAMOTIDINE 20 MG PO TABS
20.0000 mg | ORAL_TABLET | Freq: Once | ORAL | Status: AC
  Administered 2016-09-12: 20 mg via ORAL

## 2016-09-12 MED ORDER — ONDANSETRON HCL 4 MG/2ML IJ SOLN
4.0000 mg | Freq: Once | INTRAMUSCULAR | Status: DC | PRN
Start: 2016-09-12 — End: 2016-09-12

## 2016-09-12 MED ORDER — LIDOCAINE HCL (PF) 2 % IJ SOLN
INTRAMUSCULAR | Status: AC
  Filled 2016-09-12: qty 2

## 2016-09-12 MED ORDER — CIPROFLOXACIN HCL 500 MG PO TABS: 500.0000 mg | ORAL_TABLET | Freq: Two times a day (BID) | ORAL | 0 refills | Status: DC

## 2016-09-12 MED ORDER — ACETAMINOPHEN 10 MG/ML IV SOLN
INTRAVENOUS | Status: AC
  Filled 2016-09-12: qty 100

## 2016-09-12 MED ORDER — MIDAZOLAM HCL 2 MG/2ML IJ SOLN
INTRAMUSCULAR | Status: DC | PRN
  Administered 2016-09-12: 2 mg via INTRAVENOUS

## 2016-09-12 MED ORDER — DEXAMETHASONE SODIUM PHOSPHATE 10 MG/ML IJ SOLN
INTRAMUSCULAR | Status: DC | PRN
  Administered 2016-09-12: 10 mg via INTRAVENOUS

## 2016-09-12 MED ORDER — FAMOTIDINE 20 MG PO TABS
ORAL_TABLET | ORAL | Status: AC
  Filled 2016-09-12: qty 1

## 2016-09-12 MED ORDER — PROPOFOL 10 MG/ML IV BOLUS
INTRAVENOUS | Status: DC | PRN
  Administered 2016-09-12: 140 mg via INTRAVENOUS

## 2016-09-12 MED ORDER — PROPOFOL 10 MG/ML IV BOLUS
INTRAVENOUS | Status: AC
  Filled 2016-09-12: qty 20

## 2016-09-12 MED ORDER — MIDAZOLAM HCL 2 MG/2ML IJ SOLN
INTRAMUSCULAR | Status: AC
  Filled 2016-09-12: qty 2

## 2016-09-12 MED ORDER — GENTAMICIN SULFATE 40 MG/ML IJ SOLN
120.0000 mg | INTRAVENOUS | Status: DC
  Filled 2016-09-12: qty 3

## 2016-09-12 SURGICAL SUPPLY — 30 items
BACTOSHIELD CHG 4% 4OZ (MISCELLANEOUS) ×2
BASKET ZERO TIP 1.9FR (BASKET) IMPLANT
CATH URETL 5X70 OPEN END (CATHETERS) ×3 IMPLANT
CNTNR SPEC 2.5X3XGRAD LEK (MISCELLANEOUS) ×1
CONT SPEC 4OZ STER OR WHT (MISCELLANEOUS) ×2
CONTAINER SPEC 2.5X3XGRAD LEK (MISCELLANEOUS) ×1 IMPLANT
FEE TECHNICIAN ONLY PER HOUR (MISCELLANEOUS) IMPLANT
FIBER LASER 365 (Laser) ×3 IMPLANT
FIBER LASER LITHO 273 (Laser) IMPLANT
GLOVE BIO SURGEON STRL SZ7 (GLOVE) ×3 IMPLANT
GLOVE BIO SURGEON STRL SZ7.5 (GLOVE) ×3 IMPLANT
GOWN STRL REUS W/ TWL LRG LVL4 (GOWN DISPOSABLE) ×2 IMPLANT
GOWN STRL REUS W/TWL LRG LVL4 (GOWN DISPOSABLE) ×4
GOWN STRL REUS W/TWL XL LVL3 (GOWN DISPOSABLE) ×3 IMPLANT
GUIDEWIRE SUPER STIFF (WIRE) IMPLANT
INTRODUCER DILATOR DOUBLE (INTRODUCER) ×3 IMPLANT
KIT RM TURNOVER CYSTO AR (KITS) ×3 IMPLANT
PACK CYSTO AR (MISCELLANEOUS) ×3 IMPLANT
SCRUB CHG 4% DYNA-HEX 4OZ (MISCELLANEOUS) ×1 IMPLANT
SENSORWIRE 0.038 NOT ANGLED (WIRE) ×3
SET CYSTO W/LG BORE CLAMP LF (SET/KITS/TRAYS/PACK) ×3 IMPLANT
SHEATH URETERAL 13/15X36 1L (SHEATH) IMPLANT
SOL .9 NS 3000ML IRR  AL (IV SOLUTION) ×2
SOL .9 NS 3000ML IRR UROMATIC (IV SOLUTION) ×1 IMPLANT
STENT URET 6FRX24 CONTOUR (STENTS) IMPLANT
STENT URET 6FRX26 CONTOUR (STENTS) ×3 IMPLANT
SURGILUBE 2OZ TUBE FLIPTOP (MISCELLANEOUS) ×3 IMPLANT
SYRINGE IRR TOOMEY STRL 70CC (SYRINGE) ×3 IMPLANT
WATER STERILE IRR 1000ML POUR (IV SOLUTION) ×3 IMPLANT
WIRE SENSOR 0.038 NOT ANGLED (WIRE) ×1 IMPLANT

## 2016-09-12 NOTE — Interval H&P Note (Signed)
History and Physical Interval Note:  09/12/2016 10:14 AM  Melanie Ballard  has presented today for surgery, with the diagnosis of left ureteral stone  The various methods of treatment have been discussed with the patient and family. After consideration of risks, benefits and other options for treatment, the patient has consented to  Procedure(s): URETEROSCOPY WITH HOLMIUM LASER LITHOTRIPSY (Left) CYSTOSCOPY WITH STENT PLACEMENT (Left) as a surgical intervention .  The patient's history has been reviewed, patient examined, no change in status, stable for surgery.  I have reviewed the patient's chart and labs.  Questions were answered to the patient's satisfaction.     RRR Lungs clear  [facility]

## 2016-09-12 NOTE — Op Note (Signed)
Date of procedure: 09/12/16  Preoperative diagnosis:  1. Left ureteral stone   Postoperative diagnosis:  1. Left ureteral stone   Procedure: 1. Cystoscopy 2. Left ureteroscopy 3. Laser lithotripsy 4. Stone basketing 5. Left retrograde pyelogram with interpretation 6. Left ureteral stent placement 6 French by 26 cm  Surgeon: Baruch Gouty, MD  Anesthesia: General  Complications: None  Intraoperative findings: The patient had a large 1 cm stone impacted in the mid left ureter that was broken into small pieces and removed in its entirety. Left retrograde pyelogram at the end of the procedure so no filling defects.  EBL: None  Specimens: Left ureteral stone  Drains: Left 6 French by 26 cm double-J ureteral stent  Disposition: Stable to the postanesthesia care unit  Indication for procedure: The patient is a 51 y.o. female with an approximately 1 cm left midureteral stone he failed medical expulsive therapy presents today to for definitive surgical management.  After reviewing the management options for treatment, the patient elected to proceed with the above surgical procedure(s). We have discussed the potential benefits and risks of the procedure, side effects of the proposed treatment, the likelihood of the patient achieving the goals of the procedure, and any potential problems that might occur during the procedure or recuperation. Informed consent has been obtained.  Description of procedure: The patient was met in the preoperative area. All risks, benefits, and indications of the procedure were described in great detail. The patient consented to the procedure. Preoperative antibiotics were given. The patient was taken to the operative theater. General anesthesia was induced per the anesthesia service. The patient was then placed in the dorsal lithotomy position and prepped and draped in the usual sterile fashion. A preoperative timeout was called.   A 21 French 30 cystoscope  was inserted into the patient's bladder per urethra atraumatically. With some difficulty, a sensor wire was navigated past the stone up to the level of the left renal pelvis under fluoroscopy. The cystoscope was exchanged for a semirigid ureteroscope and left ureteral orifice was intubated and the left ureter scope advanced level of the mid ureteral stone. It was broken into small pieces with laser lithotripsy. Using a stone basket the pieces were then removed individually and sent to pathology. Pan ureteroscopy at this point revealed no further large stone fragments. A left retrograde pyelogram was obtained which showed no filling defects. The cystoscope was then reassembled over the sensor wire a 6 [place] by 26 cm double-J ureteral stent was placed. The sensor wire was removed. A curl seen in the left renal pelvis on fluoroscopy and the urinary bladder under direct visualization. At this point the patient's bladder was then drained. There was clear yellow urine effluxing from the stent at this point. The patient was then woken from anesthesia and transferred in stable condition to the postanesthesia care unit.  Plan:  The patient will follow-up in one week for cystoscopy and stent removal in the office. She'll need a renal ultrasound in 1 month to rule out iatrogenic hydronephrosis.  Baruch Gouty, M.D.

## 2016-09-12 NOTE — Anesthesia Postprocedure Evaluation (Signed)
Anesthesia Post Note  Patient: Melanie Ballard  Procedure(s) Performed: Procedure(s) (LRB): URETEROSCOPY WITH HOLMIUM LASER LITHOTRIPSY (Left) CYSTOSCOPY WITH STENT PLACEMENT (Left)  Patient location during evaluation: PACU Anesthesia Type: General Level of consciousness: awake and alert Pain management: pain level controlled Vital Signs Assessment: post-procedure vital signs reviewed and stable Respiratory status: spontaneous breathing, nonlabored ventilation, respiratory function stable and patient connected to nasal cannula oxygen Cardiovascular status: blood pressure returned to baseline and stable Postop Assessment: no signs of nausea or vomiting Anesthetic complications: no     Last Vitals:  Vitals:   09/12/16 1140 09/12/16 1155  BP: (!) 152/89 (!) 163/90  Pulse: 95 91  Resp: 13 14  Temp:      Last Pain:  Vitals:   09/12/16 1155  TempSrc:   PainSc: 0-No pain                 Evangelos Paulino S

## 2016-09-12 NOTE — Anesthesia Procedure Notes (Signed)
Procedure Name: LMA Insertion Date/Time: 09/12/2016 10:40 AM Performed by: Ginger Carne Pre-anesthesia Checklist: Patient identified, Emergency Drugs available, Suction available, Patient being monitored and Timeout performed Patient Re-evaluated:Patient Re-evaluated prior to inductionOxygen Delivery Method: Circle system utilized Preoxygenation: Pre-oxygenation with 100% oxygen Intubation Type: IV induction LMA: LMA inserted LMA Size: 4.0 Tube type: Oral Number of attempts: 1 Placement Confirmation: positive ETCO2 and breath sounds checked- equal and bilateral Dental Injury: Teeth and Oropharynx as per pre-operative assessment

## 2016-09-12 NOTE — Transfer of Care (Signed)
Immediate Anesthesia Transfer of Care Note  Patient: SREEJA SPIES  Procedure(s) Performed: Procedure(s): URETEROSCOPY WITH HOLMIUM LASER LITHOTRIPSY (Left) CYSTOSCOPY WITH STENT PLACEMENT (Left)  Patient Location: PACU  Anesthesia Type:General  Level of Consciousness: sedated  Airway & Oxygen Therapy: Patient Spontanous Breathing and Patient connected to nasal cannula oxygen  Post-op Assessment: Report given to RN and Post -op Vital signs reviewed and stable  Post vital signs: Reviewed and stable  Last Vitals:  Vitals:   09/12/16 0936  BP: 130/67  Pulse: 76  Resp: 16  Temp: 36.7 C    Last Pain:  Vitals:   09/12/16 0936  TempSrc: Oral  PainSc: 4       Patients Stated Pain Goal: 2 (09/12/16 0936)  Complications: No apparent anesthesia complications

## 2016-09-12 NOTE — Anesthesia Preprocedure Evaluation (Signed)
Anesthesia Evaluation  Patient identified by MRN, date of birth, ID band Patient awake    Reviewed: Allergy & Precautions, NPO status , Patient's Chart, lab work & pertinent test results, reviewed documented beta blocker date and time   Airway Mallampati: II  TM Distance: >3 FB     Dental  (+) Chipped   Pulmonary           Cardiovascular      Neuro/Psych PSYCHIATRIC DISORDERS Depression    GI/Hepatic   Endo/Other    Renal/GU      Musculoskeletal   Abdominal   Peds  Hematology   Anesthesia Other Findings   Reproductive/Obstetrics                             Anesthesia Physical Anesthesia Plan  ASA: II  Anesthesia Plan: General   Post-op Pain Management:    Induction: Intravenous  Airway Management Planned: LMA  Additional Equipment:   Intra-op Plan:   Post-operative Plan:   Informed Consent: I have reviewed the patients History and Physical, chart, labs and discussed the procedure including the risks, benefits and alternatives for the proposed anesthesia with the patient or authorized representative who has indicated his/her understanding and acceptance.     Plan Discussed with: CRNA  Anesthesia Plan Comments:         Anesthesia Quick Evaluation

## 2016-09-12 NOTE — Anesthesia Post-op Follow-up Note (Cosign Needed)
Anesthesia QCDR form completed.        

## 2016-09-12 NOTE — Discharge Instructions (Signed)

## 2016-09-12 NOTE — H&P (View-Only) (Signed)
  09/11/2016 11:51 AM   Melanie Ballard 06/09/1966 [id number]  Referring provider: Wendy McNeill, MD [address] [place], [place] [address]  Chief Complaint  Patient presents with  . New Patient (Initial Visit)    kidney stone referred by ER    HPI: Patient is a 50 year old Caucasian female who presents/is referred by [facility]'s ED for nephrolithiasis.  Patient states the onset of the pain was one weeks ago.   It was sharp.  It lasted for several hours.  The pain was located in the left flank and radiated to the left lower abdomen.  The pain was a 10/10.  Nothing made the pain better.   Nothing made the pain worse.  She did have or did not have gross hematuria, fevers or chills, she has had nausea and vomiting.  In the ED, she received morphine, dilaudid and Zofran.  Her UA demonstrated TNTC RBC's and 6-10 WBC's.    Her serum creatinine was 1.50, her prior serum creatinine was 1.20.  Her WBC count was 11.4.    CT Renal stone study performed on 09/06/2016 noted a 7 mm mid left ureteral calculus is associated with left hydronephrosis.  Right nephrolithiasis.  Degenerative disc disease at L4-5.  Chronic changes.  I have independently reviewed the films.    Today, she is still having 10/10 pain.  UA today is unremarkable.    She does not have a prior history of stones.         PMH: Past Medical History:  Diagnosis Date  . Depression   . Kidney stone     Surgical History: Past Surgical History:  Procedure Laterality Date  . ABDOMINAL HYSTERECTOMY    . CESAREAN SECTION    . CHOLECYSTECTOMY    . TONSILLECTOMY      Home Medications:  Allergies as of 09/11/2016      Reactions   Penicillins Shortness Of Breath      Medication List       Accurate as of 09/11/16 11:51 AM. Always use your most recent med list.          buPROPion 200 MG 12 hr tablet Commonly known as:  WELLBUTRIN SR Take 200 mg by mouth.   ciprofloxacin 500 MG tablet Commonly known as:   CIPRO Take 1 tablet (500 mg total) by mouth 2 (two) times daily.   DULOXETINE HCL PO Take 90 mg by mouth.   EQL VITAMIN D3 GUMMIES PO Take by mouth.   oxyCODONE-acetaminophen 5-325 MG tablet Commonly known as:  ROXICET Take 1 tablet by mouth every 6 (six) hours as needed.   tamsulosin 0.4 MG Caps capsule Commonly known as:  FLOMAX Take 1 capsule (0.4 mg total) by mouth daily.   zolpidem 10 MG tablet Commonly known as:  AMBIEN Take 10 mg by mouth.       Allergies:  Allergies  Allergen Reactions  . Penicillins Shortness Of Breath    Family History: Family History  Problem Relation Age of Onset  . Prostate cancer Neg Hx   . Kidney cancer Neg Hx   . Bladder Cancer Neg Hx     Social History:  reports that she has never smoked. She has never used smokeless tobacco. She reports that she drinks alcohol. She reports that she does not use drugs.  ROS: UROLOGY Frequent Urination?: Yes Hard to postpone urination?: Yes Burning/pain with urination?: No Get up at night to urinate?: Yes Leakage of urine?: Yes Urine stream starts and   stops?: No Trouble starting stream?: No Do you have to strain to urinate?: No Blood in urine?: No Urinary tract infection?: No Sexually transmitted disease?: No Injury to kidneys or bladder?: No Painful intercourse?: No Weak stream?: No Currently pregnant?: No Vaginal bleeding?: No Last menstrual period?: n  Gastrointestinal Nausea?: Yes Vomiting?: Yes Indigestion/heartburn?: Yes Diarrhea?: No Constipation?: No  Constitutional Fever: No Night sweats?: Yes Weight loss?: No Fatigue?: Yes  Skin Skin rash/lesions?: No Itching?: No  Eyes Blurred vision?: No Double vision?: No  Ears/Nose/Throat Sore throat?: No Sinus problems?: No  Hematologic/Lymphatic Swollen glands?: No Easy bruising?: No  Cardiovascular Leg swelling?: No Chest pain?: No  Respiratory Cough?: No Shortness of breath?: No  Endocrine Excessive  thirst?: No  Musculoskeletal Back pain?: Yes Joint pain?: No  Neurological Headaches?: No Dizziness?: No  Psychologic Depression?: No Anxiety?: No  Physical Exam: BP (!) 152/83   Pulse 78   Ht 5' 4" (1.626 m)   Wt 168 lb 9.6 oz (76.5 kg)   BMI 28.94 kg/m   Constitutional: Well nourished. Alert and oriented, No acute distress. HEENT: [place] AT, moist mucus membranes. Trachea midline, no masses. Cardiovascular: No clubbing, cyanosis, or edema. Respiratory: Normal respiratory effort, no increased work of breathing. GI: Abdomen is soft, non tender, non distended, no abdominal masses. Liver and spleen not palpable.  No hernias appreciated.  Stool sample for occult testing is not indicated.   GU: Left CVA tenderness.  No bladder fullness or masses.   Skin: No rashes, bruises or suspicious lesions. Lymph: No cervical or inguinal adenopathy. Neurologic: Grossly intact, no focal deficits, moving all 4 extremities. Psychiatric: Normal mood and affect.  Laboratory Data: Lab Results  Component Value Date   WBC 11.4 (H) 09/06/2016   HGB 14.0 09/06/2016   HCT 39.6 09/06/2016   MCV 88.5 09/06/2016   PLT 254 09/06/2016    Lab Results  Component Value Date   CREATININE 1.50 (H) 09/06/2016     Lab Results  Component Value Date   AST 35 09/06/2016   Lab Results  Component Value Date   ALT 29 09/06/2016     Urinalysis Unremarkable.  See EPIC.    Pertinent Imaging: CLINICAL DATA:  Left abdominal pain  EXAM: CT ABDOMEN AND PELVIS WITHOUT CONTRAST  TECHNIQUE: Multidetector CT imaging of the abdomen and pelvis was performed following the standard protocol without IV contrast.  COMPARISON:  10/11/2013  FINDINGS: Lower chest: No acute abnormality.  Hepatobiliary: Unremarkable liver.  Postcholecystectomy.  Pancreas: Unremarkable  Spleen: Calcified granulomata.  Adrenals/Urinary Tract: Moderate to severe left hydronephrosis. 7 mm mid left ureteral  calculus. Tiny calculus in the upper pole of the right kidney.  Stomach/Bowel: Stomach is unremarkable. Normal appendix. No evidence of small-bowel obstruction.  Vascular/Lymphatic: No abnormal adenopathy. No evidence of aortic aneurysm. Mild atherosclerotic calcifications of the aorta and iliac arteries.  Reproductive: Uterus is absent. Left adnexa is unremarkable. Cystic abnormality in the right adnexa previously measured 5.6 cm, and state measures 3.7 cm. Regression supports benign etiology.  Other: No free-fluid.  Pelvic phleboliths are noted.  Musculoskeletal: No vertebral compression deformity. Broad-based disc herniation at L4-5 possibly causes spinal stenosis.  IMPRESSION: 7 mm mid left ureteral calculus is associated with left hydronephrosis.  Right nephrolithiasis.  Degenerative disc disease at L4-5.  Chronic changes.   Electronically Signed   By: Arthur  Hoss M.D.   On: 09/06/2016 09:56  Assessment & Plan:    Patient with an obstructing 8 mm left mid ureteral stone will undergo   left ureteroscopy with laser lithotripsy and ureteral stent placement for definitive treatment.    1. Left ureteral stone  - discussed treatment approaches to renal stones including observation, medical expulsive therapy, ureteroscopic stone extraction and shockwave lithotripsy - discussed the relative merits of the techniques including risks, benefits, and predicted efficacy with each techniques - she would like to pursue URS/LL/ureteral stent placement  - schedule left ureteroscopy with laser lithotripsy and ureteral stent placement  - explained to the patient how the procedure is performed and the risks involved  - informed patient that they will have a stent placed during the procedure and will remain in place after the procedure for a short time.   - stent may be removed in the office with a cystoscope or patient may be instructed to remove the stent themselves by the  string  - described "stent pain" as feelings of needing to urinate/overactive bladder and a warm, tingling sensation to intense pain in the affected flank  - residual stones within the kidney or ureter may be present after the procedure and may need to have these addressed at a different encounter  - injury to the ureter is the most common intra-operative risk, it may result in an open procedure to correct the defect  - infection and bleeding are also risks  - explained the risks of general anesthesia, such as: MI, CVA, paralysis, coma and/or death.  - advised to contact our office or seek treatment in the ED if becomes febrile or pain/ vomiting are difficult control in order to arrange for emergent/urgent intervention  2. Left hydronephrosis  - obtain RUS to ensure the hydronephrosis has resolved once she has recovered from procedure  3. Microscopic hematuria  - UA today unremarkable  - continue to monitor the patient's UA after the treatment/passage of the stone to ensure the hematuria has resolved  - if hematuria persists, we will pursue a hematuria workup with CT Urogram and cystoscopy if appropriate.  Return for left URS/LL/ureteral stent placement.  These notes generated with voice recognition software. I apologize for typographical errors.  Ameila Weldon, PA-C  [facility] [address], [address] [place], [place] [address] [phone]  

## 2016-09-13 ENCOUNTER — Telehealth: Payer: Self-pay | Admitting: Urology

## 2016-09-13 NOTE — Telephone Encounter (Signed)
-----   Message from Hildred Laser, MD sent at 09/12/2016 11:23 AM EDT ----- Patient needs a cysto/stent removal in the office during week of April 9-13. I am not in the office next week. Please schedule in any available MD spot. Thanks.

## 2016-09-13 NOTE — Telephone Encounter (Signed)
done

## 2016-09-14 LAB — CULTURE, URINE COMPREHENSIVE

## 2016-09-16 ENCOUNTER — Ambulatory Visit: Payer: Self-pay | Admitting: Urology

## 2016-09-16 ENCOUNTER — Encounter: Payer: Self-pay | Admitting: Urology

## 2016-09-16 ENCOUNTER — Ambulatory Visit (INDEPENDENT_AMBULATORY_CARE_PROVIDER_SITE_OTHER): Payer: 59 | Admitting: Urology

## 2016-09-16 VITALS — BP 136/91 | HR 84 | Ht 64.0 in | Wt 168.0 lb

## 2016-09-16 DIAGNOSIS — N201 Calculus of ureter: Secondary | ICD-10-CM | POA: Diagnosis not present

## 2016-09-16 LAB — URINALYSIS, COMPLETE
Bilirubin, UA: NEGATIVE
Glucose, UA: NEGATIVE
KETONES UA: NEGATIVE
NITRITE UA: NEGATIVE
PH UA: 6 (ref 5.0–7.5)
Urobilinogen, Ur: 0.2 mg/dL (ref 0.2–1.0)

## 2016-09-16 LAB — MICROSCOPIC EXAMINATION

## 2016-09-16 MED ORDER — CIPROFLOXACIN HCL 500 MG PO TABS
500.0000 mg | ORAL_TABLET | Freq: Once | ORAL | Status: AC
  Administered 2016-09-16: 500 mg via ORAL

## 2016-09-16 MED ORDER — LIDOCAINE HCL 2 % EX GEL
1.0000 "application " | Freq: Once | CUTANEOUS | Status: AC
  Administered 2016-09-16: 1 via URETHRAL

## 2016-09-16 NOTE — Progress Notes (Signed)
09/16/2016 2:06 PM   Melanie Ballard Mar 27, 1966 [id number]  Referring provider: Gweneth Dimitri, MD [address] [place], [place] [address]  Chief Complaint  Patient presents with  . Cysto Stent Removal    HPI: The patient had successful laser lithotripsy April 5. The patient is here for stent removal. It was recommended for her to have a renal ultrasound in about a month.  The patient has a little bit of discomfort at the end a urination and a little bit of back pain by clinically is not infected  Her urine was sent for culture  The patient underwent flexible cystoscopy at limited bladder capacity. She had mild erythema at the floor the bladder and the stent was easily visualized. The urine was clear. I did not see any stone fragments. With grasping forceps the stent was easily removed and the patient was very comfortable     PMH: Past Medical History:  Diagnosis Date  . Depression   . Kidney stone     Surgical History: Past Surgical History:  Procedure Laterality Date  . ABDOMINAL HYSTERECTOMY    . CESAREAN SECTION    . CHOLECYSTECTOMY    . CYSTOSCOPY WITH STENT PLACEMENT Left 09/12/2016   Procedure: CYSTOSCOPY WITH STENT PLACEMENT;  Surgeon: Hildred Laser, MD;  Location: [facility] ORS;  Service: Urology;  Laterality: Left;  . TONSILLECTOMY    . URETEROSCOPY WITH HOLMIUM LASER LITHOTRIPSY Left 09/12/2016   Procedure: URETEROSCOPY WITH HOLMIUM LASER LITHOTRIPSY;  Surgeon: Hildred Laser, MD;  Location: [facility] ORS;  Service: Urology;  Laterality: Left;    Home Medications:  Allergies as of 09/16/2016      Reactions   Penicillins Shortness Of Breath      Medication List       Accurate as of 09/16/16  2:06 PM. Always use your most recent med list.          DULoxetine 30 MG capsule Commonly known as:  CYMBALTA Take 90 mg by mouth at bedtime.   EQL VITAMIN D3 GUMMIES PO Take 2 each by mouth daily.   oxyCODONE-acetaminophen 5-325 MG tablet Commonly known  as:  ROXICET Take 1 tablet by mouth every 6 (six) hours as needed.   tamsulosin 0.4 MG Caps capsule Commonly known as:  FLOMAX Take 1 capsule (0.4 mg total) by mouth daily.       Allergies:  Allergies  Allergen Reactions  . Penicillins Shortness Of Breath    Family History: Family History  Problem Relation Age of Onset  . Prostate cancer Neg Hx   . Kidney cancer Neg Hx   . Bladder Cancer Neg Hx     Social History:  reports that she has never smoked. She has never used smokeless tobacco. She reports that she drinks alcohol. She reports that she does not use drugs.  ROS:                                        Physical Exam: BP (!) 136/91   Pulse 84   Ht  (1.626 m)   Wt 168 lb (76.2 kg)   BMI 28.84 kg/m   Constitutional:  Alert and oriented, No acute distress.   Laboratory Data: Lab Results  Component Value Date   WBC 5.6 09/11/2016   HGB 14.0 09/06/2016   HCT 37.6 09/11/2016   MCV 90 09/11/2016   PLT 239 09/11/2016  Lab Results  Component Value Date   CREATININE 1.22 (H) 09/11/2016    No results found for: PSA  No results found for: TESTOSTERONE  No results found for: HGBA1C  Urinalysis    Component Value Date/Time   COLORURINE YELLOW (A) 09/06/2016 0920   APPEARANCEUR Clear 09/11/2016 1115   LABSPEC 1.019 09/06/2016 0920   LABSPEC 1.016 10/11/2013 0132   PHURINE 6.0 09/06/2016 0920   GLUCOSEU Negative 09/11/2016 1115   GLUCOSEU Negative 10/11/2013 0132   HGBUR LARGE (A) 09/06/2016 0920   BILIRUBINUR Negative 09/11/2016 1115   BILIRUBINUR Negative 10/11/2013 0132   KETONESUR 5 (A) 09/06/2016 0920   PROTEINUR Negative 09/11/2016 1115   PROTEINUR 30 (A) 09/06/2016 0920   NITRITE Negative 09/11/2016 1115   NITRITE NEGATIVE 09/06/2016 0920   LEUKOCYTESUR Trace (A) 09/11/2016 1115   LEUKOCYTESUR Negative 10/11/2013 0132    Pertinent Imaging: none  Assessment & Plan:  The patient will follow up to see Dr.  Owens Shark in 1 month with a renal ultrasound. We will call if the urine culture is positive. Postoperative recommendations and warning signs discussed  1. Left ureteral stone 2. Frequency   - Urinalysis, Complete - lidocaine (XYLOCAINE) 2 % jelly 1 application; Place 1 application into the urethra once. - ciprofloxacin (CIPRO) tablet 500 mg; Take 1 tablet (500 mg total) by mouth once.   No Follow-up on file.  Martina Sinner, MD  [facility] [address] [place], [place] [address] [phone]

## 2016-09-16 NOTE — Addendum Note (Signed)
Addended by: Lonna Cobb on: 09/16/2016 04:27 PM   Modules accepted: Orders

## 2016-09-18 LAB — CULTURE, URINE COMPREHENSIVE

## 2016-09-23 LAB — STONE ANALYSIS
CA OXALATE, MONOHYDR.: 70 %
CA PHOS CRY STONE QL IR: 10 %
Ca Oxalate,Dihydrate: 20 %
Stone Weight KSTONE: 22.7 mg

## 2016-10-08 ENCOUNTER — Other Ambulatory Visit: Payer: Self-pay | Admitting: Urology

## 2016-10-16 ENCOUNTER — Ambulatory Visit
Admission: RE | Admit: 2016-10-16 | Discharge: 2016-10-16 | Disposition: A | Discharge status: Home / Self Care | Payer: 59 | Source: Ambulatory Visit | Attending: Urology | Admitting: Urology

## 2016-10-16 DIAGNOSIS — N201 Calculus of ureter: Secondary | ICD-10-CM

## 2016-10-18 ENCOUNTER — Ambulatory Visit: Payer: 59 | Admitting: Urology

## 2016-10-18 ENCOUNTER — Encounter: Payer: Self-pay | Admitting: Urology

## 2016-10-18 VITALS — BP 130/75 | HR 101 | Ht 64.0 in | Wt 172.2 lb

## 2016-10-18 DIAGNOSIS — N200 Calculus of kidney: Secondary | ICD-10-CM | POA: Diagnosis not present

## 2016-10-18 NOTE — Progress Notes (Signed)
10/18/2016 3:36 PM   Melanie Ballard Oct 13, 1965 [id number]  Referring provider: Gweneth DimitriMcNeill, Wendy, MD [address] [place], [place] [id number]  Chief Complaint  Patient presents with  . Follow-up    HPI: The patient is a 51 year old female presents for post instrumentation renal ultrasound after undergoing left ureteroscopy in April 2018 for a 1 cm mid left ureteral stone.  She has done well since her stent came out approximately one week after surgery. This was her first stone event.  Her stone was 20% calcium oxalate dihydrate, 70% calcium oxalate monohydrate, 10% calcium phosphate.  She does have a history of being on Topamax, however she stopped this medication approximately 2 years ago.   PMH: Past Medical History:  Diagnosis Date  . Depression   . Kidney stone     Surgical History: Past Surgical History:  Procedure Laterality Date  . ABDOMINAL HYSTERECTOMY    . CESAREAN SECTION    . CHOLECYSTECTOMY    . CYSTOSCOPY WITH STENT PLACEMENT Left 09/12/2016   Procedure: CYSTOSCOPY WITH STENT PLACEMENT;  Surgeon: Hildred LaserBrian James Aleria Maheu, MD;  Location: [facility] ORS;  Service: Urology;  Laterality: Left;  . TONSILLECTOMY    . URETEROSCOPY WITH HOLMIUM LASER LITHOTRIPSY Left 09/12/2016   Procedure: URETEROSCOPY WITH HOLMIUM LASER LITHOTRIPSY;  Surgeon: Hildred LaserBrian James Karlin Binion, MD;  Location: [facility] ORS;  Service: Urology;  Laterality: Left;    Home Medications:  Allergies as of 10/18/2016      Reactions   Penicillins Shortness Of Breath      Medication List       Accurate as of 10/18/16  3:36 PM. Always use your most recent med list.          DULoxetine 30 MG capsule Commonly known as:  CYMBALTA Take 90 mg by mouth at bedtime.   EQL VITAMIN D3 GUMMIES PO Take 2 each by mouth daily.   oxyCODONE-acetaminophen 5-325 MG tablet Commonly known as:  ROXICET Take 1 tablet by mouth every 6 (six) hours as needed.   tamsulosin 0.4 MG Caps capsule Commonly known as:  FLOMAX TAKE 1  CAPSULE (0.4 MG TOTAL) BY MOUTH DAILY.       Allergies:  Allergies  Allergen Reactions  . Penicillins Shortness Of Breath    Family History: Family History  Problem Relation Age of Onset  . Prostate cancer Neg Hx   . Kidney cancer Neg Hx   . Bladder Cancer Neg Hx     Social History:  reports that she has never smoked. She has never used smokeless tobacco. She reports that she drinks alcohol. She reports that she does not use drugs.  ROS: UROLOGY Frequent Urination?: No Hard to postpone urination?: No Burning/pain with urination?: No Get up at night to urinate?: No Leakage of urine?: No Urine stream starts and stops?: No Trouble starting stream?: No Do you have to strain to urinate?: No Blood in urine?: No Urinary tract infection?: No Sexually transmitted disease?: No Injury to kidneys or bladder?: No Painful intercourse?: No Weak stream?: No Currently pregnant?: No Vaginal bleeding?: No Last menstrual period?: n  Gastrointestinal Nausea?: No Vomiting?: No Indigestion/heartburn?: No Diarrhea?: No Constipation?: No  Constitutional Fever: No Night sweats?: No Weight loss?: No Fatigue?: No  Skin Skin rash/lesions?: No Itching?: No  Eyes Blurred vision?: No Double vision?: No  Ears/Nose/Throat Sore throat?: No Sinus problems?: No  Hematologic/Lymphatic Swollen glands?: No Easy bruising?: No  Cardiovascular Leg swelling?: No Chest pain?: No  Respiratory Cough?: No Shortness of breath?: No  Endocrine Excessive thirst?: No  Musculoskeletal Back pain?: No Joint pain?: No  Neurological Headaches?: No Dizziness?: No  Psychologic Depression?: No Anxiety?: No  Physical Exam: BP 130/75 (BP Location: Left Arm, Patient Position: Sitting, Cuff Size: Normal)   Pulse (!) 101   Ht 5\' 4"  (1.626 m)   Wt 172 lb 3.2 oz (78.1 kg)   BMI 29.56 kg/m   Constitutional:  Alert and oriented, No acute distress. HEENT: [place] AT, moist mucus membranes.   Trachea midline, no masses. Cardiovascular: No clubbing, cyanosis, or edema. Respiratory: Normal respiratory effort, no increased work of breathing. GI: Abdomen is soft, nontender, nondistended, no abdominal masses GU: No CVA tenderness.  Skin: No rashes, bruises or suspicious lesions. Lymph: No cervical or inguinal adenopathy. Neurologic: Grossly intact, no focal deficits, moving all 4 extremities. Psychiatric: Normal mood and affect.  Laboratory Data: Lab Results  Component Value Date   WBC 5.6 09/11/2016   HGB 14.0 09/06/2016   HCT 37.6 09/11/2016   MCV 90 09/11/2016   PLT 239 09/11/2016    Lab Results  Component Value Date   CREATININE 1.22 (H) 09/11/2016    No results found for: PSA  No results found for: TESTOSTERONE  No results found for: HGBA1C  Urinalysis    Component Value Date/Time   COLORURINE YELLOW (A) 09/06/2016 0920   APPEARANCEUR Cloudy (A) 09/16/2016 1340   LABSPEC 1.019 09/06/2016 0920   LABSPEC 1.016 10/11/2013 0132   PHURINE 6.0 09/06/2016 0920   GLUCOSEU Negative 09/16/2016 1340   GLUCOSEU Negative 10/11/2013 0132   HGBUR LARGE (A) 09/06/2016 0920   BILIRUBINUR Negative 09/16/2016 1340   BILIRUBINUR Negative 10/11/2013 0132   KETONESUR 5 (A) 09/06/2016 0920   PROTEINUR 3+ (A) 09/16/2016 1340   PROTEINUR 30 (A) 09/06/2016 0920   NITRITE Negative 09/16/2016 1340   NITRITE NEGATIVE 09/06/2016 0920   LEUKOCYTESUR 1+ (A) 09/16/2016 1340   LEUKOCYTESUR Negative 10/11/2013 0132    Pertinent Imaging: Renal ultrasound without hydronephrosis.  Assessment & Plan:    1. Nephrolithiasis I went over the patient's stone report as well as ways to prevent future stone formation. I gave her the ABCs of kidney stone prevention as well as went over this pamphlet. As this is her first stone event, I do not think she needs any further intervention at this time. She'll follow-up with [place] as needed.  Return if symptoms worsen or fail to improve.  Hildred Laser, MD  [facility] [address], [address] [place], [place] [address] [phone]

## 2016-12-02 ENCOUNTER — Other Ambulatory Visit: Payer: Self-pay | Admitting: Urology

## 2017-12-19 ENCOUNTER — Ambulatory Visit: Payer: 59 | Admitting: Internal Medicine

## 2017-12-23 ENCOUNTER — Encounter: Payer: Self-pay | Admitting: Internal Medicine

## 2017-12-23 ENCOUNTER — Ambulatory Visit: Payer: 59 | Admitting: Internal Medicine

## 2017-12-23 VITALS — BP 136/84 | HR 98 | Temp 98.0°F | Ht 64.0 in | Wt 176.6 lb

## 2017-12-23 DIAGNOSIS — E669 Obesity, unspecified: Secondary | ICD-10-CM | POA: Diagnosis not present

## 2017-12-23 DIAGNOSIS — Z0000 Encounter for general adult medical examination without abnormal findings: Secondary | ICD-10-CM

## 2017-12-23 DIAGNOSIS — Z1159 Encounter for screening for other viral diseases: Secondary | ICD-10-CM

## 2017-12-23 DIAGNOSIS — F32A Depression, unspecified: Secondary | ICD-10-CM

## 2017-12-23 DIAGNOSIS — Z0184 Encounter for antibody response examination: Secondary | ICD-10-CM

## 2017-12-23 DIAGNOSIS — R739 Hyperglycemia, unspecified: Secondary | ICD-10-CM | POA: Diagnosis not present

## 2017-12-23 DIAGNOSIS — F329 Major depressive disorder, single episode, unspecified: Secondary | ICD-10-CM | POA: Diagnosis not present

## 2017-12-23 DIAGNOSIS — Z23 Encounter for immunization: Secondary | ICD-10-CM | POA: Diagnosis not present

## 2017-12-23 DIAGNOSIS — Z1329 Encounter for screening for other suspected endocrine disorder: Secondary | ICD-10-CM | POA: Diagnosis not present

## 2017-12-23 DIAGNOSIS — Z1231 Encounter for screening mammogram for malignant neoplasm of breast: Secondary | ICD-10-CM | POA: Diagnosis not present

## 2017-12-23 DIAGNOSIS — R319 Hematuria, unspecified: Secondary | ICD-10-CM | POA: Diagnosis not present

## 2017-12-23 DIAGNOSIS — Z13220 Encounter for screening for lipoid disorders: Secondary | ICD-10-CM

## 2017-12-23 DIAGNOSIS — F419 Anxiety disorder, unspecified: Secondary | ICD-10-CM | POA: Insufficient documentation

## 2017-12-23 DIAGNOSIS — E559 Vitamin D deficiency, unspecified: Secondary | ICD-10-CM | POA: Diagnosis not present

## 2017-12-23 MED ORDER — DULOXETINE HCL 30 MG PO CPEP: 90.0000 mg | ORAL_CAPSULE | Freq: Every day | ORAL | 1 refills | Status: DC

## 2017-12-23 NOTE — Progress Notes (Signed)
Chief Complaint  Patient presents with  . Establish Care    NP   NP no complaints needs to establish PCP  1. Needs referral mammogram  2. Depression stable needs refill cymbalta 90 mg qd helping    Review of Systems  Constitutional: Negative for weight loss.  HENT: Negative for hearing loss.   Eyes: Negative for blurred vision.  Respiratory: Negative for shortness of breath.   Cardiovascular: Negative for chest pain.  Gastrointestinal: Negative for abdominal pain.  Musculoskeletal: Negative for falls.  Skin: Negative for rash.  Neurological: Negative for headaches.  Psychiatric/Behavioral: Negative for depression.   Past Medical History:  Diagnosis Date  . Allergy   . Depression   . History of blood transfusion    after hysterectomy complication in 3846 where bladder laceration and severe infection   . History of chickenpox   . Kidney stone   . Kidney stones   . Migraines   . UTI (urinary tract infection)    recurrent x 5 not had since 2014    Past Surgical History:  Procedure Laterality Date  . ABDOMINAL HYSTERECTOMY     reason DUB; no h/o abnormal Pap. Dr. [facility] still has ovaries and FT intact not cervix   . CESAREAN SECTION     1994  . CHOLECYSTECTOMY     1993  . CYSTOSCOPY WITH STENT PLACEMENT Left 09/12/2016   Procedure: CYSTOSCOPY WITH STENT PLACEMENT;  Surgeon: Nickie Retort, MD;  Location: [facility];  Service: Urology;  Laterality: Left;  . KIDNEY STONE SURGERY    . MENISCUS REPAIR     b/l knees   . thermal hydroablation     DUB  . TONSILLECTOMY    . URETEROSCOPY WITH HOLMIUM LASER LITHOTRIPSY Left 09/12/2016   Procedure: URETEROSCOPY WITH HOLMIUM LASER LITHOTRIPSY;  Surgeon: Nickie Retort, MD;  Location: [facility];  Service: Urology;  Laterality: Left;   Family History  Problem Relation Age of Onset  . Arthritis Mother   . Depression Mother   . Diabetes Mother   . Hearing loss Mother   . Stroke Mother   . Early death Father   . Heart  disease Father        died age 59 mi   . Hyperlipidemia Father   . Hypertension Father   . COPD Sister   . Depression Sister   . Diabetes Sister   . Early death Sister   . Hyperlipidemia Sister   . Hypertension Sister   . Cancer Maternal Grandfather        lung smoker   . Heart disease Paternal Grandfather   . Depression Sister   . Diabetes Sister   . Diabetes Sister   . Prostate cancer Neg Hx   . Kidney cancer Neg Hx   . Bladder Cancer Neg Hx    Social History   Socioeconomic History  . Marital status: Married    Spouse name: Not on file  . Number of children: Not on file  . Years of education: Not on file  . Highest education level: Not on file  Occupational History  . Not on file  Social Needs  . Financial resource strain: Not on file  . Food insecurity:    Worry: Not on file    Inability: Not on file  . Transportation needs:    Medical: Not on file    Non-medical: Not on file  Tobacco Use  . Smoking status: Never Smoker  . Smokeless tobacco: Never  Used  Substance and Sexual Activity  . Alcohol use: Yes  . Drug use: No  . Sexual activity: Not on file  Lifestyle  . Physical activity:    Days per week: Not on file    Minutes per session: Not on file  . Stress: Not on file  Relationships  . Social connections:    Talks on phone: Not on file    Gets together: Not on file    Attends religious service: Not on file    Active member of club or organization: Not on file    Attends meetings of clubs or organizations: Not on file    Relationship status: Not on file  . Intimate partner violence:    Fear of current or ex partner: Not on file    Emotionally abused: Not on file    Physically abused: Not on file    Forced sexual activity: Not on file  Other Topics Concern  . Not on file  Social History Narrative  . Not on file   Current Meds  Medication Sig  . MELATONIN PO Take by mouth.   Allergies  Allergen Reactions  . Penicillins Shortness Of Breath     rash   No results found for this or any previous visit (from the past 2160 hour(s)). Objective  Body mass index is 30.31 kg/m. Wt Readings from Last 3 Encounters:  12/23/17 176 lb 9.6 oz (80.1 kg)  10/18/16 172 lb 3.2 oz (78.1 kg)  09/16/16 168 lb (76.2 kg)   Temp Readings from Last 3 Encounters:  12/23/17 98 F (36.7 C) (Oral)  09/12/16 98.1 F (36.7 C)  09/06/16 98.4 F (36.9 C) (Oral)   BP Readings from Last 3 Encounters:  12/23/17 136/84  10/18/16 130/75  09/16/16 (!) 136/91   Pulse Readings from Last 3 Encounters:  12/23/17 98  10/18/16 (!) 101  09/16/16 84    Physical Exam  Constitutional: She is oriented to person, place, and time. Vital signs are normal. She appears well-developed and well-nourished. She is cooperative.  HENT:  Head: Normocephalic and atraumatic.  Mouth/Throat: Oropharynx is clear and moist and mucous membranes are normal.  Eyes: Pupils are equal, round, and reactive to light. Conjunctivae are normal.  Cardiovascular: Normal rate, regular rhythm and normal heart sounds.  Pulmonary/Chest: Effort normal and breath sounds normal.  Neurological: She is alert and oriented to person, place, and time. Gait normal.  Skin: Skin is warm, dry and intact.  Tanned skin   Psychiatric: She has a normal mood and affect. Her speech is normal and behavior is normal. Judgment and thought content normal. Cognition and memory are normal.  Nursing note and vitals reviewed.   Assessment   1. Depression stable in remission  2. Obesity BMI 30.31  3. HM Plan   1. Refilled cymbalta on 90 mg qd working  2.  rec healthy diet and exercise  3.  Has not had flu shot in a while Tdap given today  Check hep B and MMR status Declines HIV testing   Check labs CMET, CBC, lipid, UA, TSH, T4, vit D, A1C, hep B, MMR of note ate a plum today  S/p hysterectomy DUB no h/o abnormal pap ovaries and FT intact no cervix will not do further  Referred for mammogram today   Colonoscopy [facility] in [place] requested records today  Never smoker  No need for dermatology now  Provider: Dr. Olivia Mackie McLean-Scocuzza-[facility]

## 2017-12-23 NOTE — Patient Instructions (Addendum)
F/u in 3-4 months  Tdap//DTaP Vaccine (Diphtheria, Tetanus, and Pertussis): What You Need to Know 1. Why get vaccinated? Diphtheria, tetanus, and pertussis are serious diseases caused by bacteria. Diphtheria and pertussis are spread from person to person. Tetanus enters the body through cuts or wounds. DIPHTHERIA causes a thick covering in the back of the throat.  It can lead to breathing problems, paralysis, heart failure, and even death.  TETANUS (Lockjaw) causes painful tightening of the muscles, usually all over the body.  It can lead to "locking" of the jaw so the victim cannot open his mouth or swallow. Tetanus leads to death in up to 2 out of 10 cases.  PERTUSSIS (Whooping Cough) causes coughing spells so bad that it is hard for infants to eat, drink, or breathe. These spells can last for weeks.  It can lead to pneumonia, seizures (jerking and staring spells), brain damage, and death.  Diphtheria, tetanus, and pertussis vaccine (DTaP) can help prevent these diseases. Most children who are vaccinated with DTaP will be protected throughout childhood. Many more children would get these diseases if we stopped vaccinating. DTaP is a safer version of an older vaccine called DTP. DTP is no longer used in the [place]. 2. Who should get DTaP vaccine and when? Children should get 5 doses of DTaP vaccine, one dose at each of the following ages:  2 months  4 months  6 months  15-18 months  4-6 years  DTaP may be given at the same time as other vaccines. 3. Some children should not get DTaP vaccine or should wait  Children with minor illnesses, such as a cold, may be vaccinated. But children who are moderately or severely ill should usually wait until they recover before getting DTaP vaccine.  Any child who had a life-threatening allergic reaction after a dose of DTaP should not get another dose.  Any child who suffered a brain or nervous system disease within 7 days after a  dose of DTaP should not get another dose.  Talk with your doctor if your child: ? had a seizure or collapsed after a dose of DTaP, ? cried non-stop for 3 hours or more after a dose of DTaP, ? had a fever over 105F after a dose of DTaP. Ask your doctor for more information. Some of these children should not get another dose of pertussis vaccine, but may get a vaccine without pertussis, called DT. 4. Older children and adults DTaP is not licensed for adolescents, adults, or children 817 years of age and older. But older people still need protection. A vaccine called Tdap is similar to DTaP. A single dose of Tdap is recommended for people 11 through 52 years of age. Another vaccine, called Td, protects against tetanus and diphtheria, but not pertussis. It is recommended every 10 years. There are separate Vaccine Information Statements for these vaccines. 5. What are the risks from DTaP vaccine? Getting diphtheria, tetanus, or pertussis disease is much riskier than getting DTaP vaccine. However, a vaccine, like any medicine, is capable of causing serious problems, such as severe allergic reactions. The risk of DTaP vaccine causing serious harm, or death, is extremely small. Mild problems (common)  Fever (up to about 1 child in 4)  Redness or swelling where the shot was given (up to about 1 child in 4)  Soreness or tenderness where the shot was given (up to about 1 child in 4) These problems occur more often after the 4th and 5th doses of  the DTaP series than after earlier doses. Sometimes the 4th or 5th dose of DTaP vaccine is followed by swelling of the entire arm or leg in which the shot was given, lasting 1-7 days (up to about 1 child in 30). Other mild problems include:  Fussiness (up to about 1 child in 3)  Tiredness or poor appetite (up to about 1 child in 10)  Vomiting (up to about 1 child in 50) These problems generally occur 1-3 days after the shot. Moderate problems  (uncommon)  Seizure (jerking or staring) (about 1 child out of 14,000)  Non-stop crying, for 3 hours or more (up to about 1 child out of 1,000)  High fever, over 105F (about 1 child out of 16,000) Severe problems (very rare)  Serious allergic reaction (less than 1 out of a million doses)  Several other severe problems have been reported after DTaP vaccine. These include: ? Long-term seizures, coma, or lowered consciousness ? Permanent brain damage. These are so rare it is hard to tell if they are caused by the vaccine. Controlling fever is especially important for children who have had seizures, for any reason. It is also important if another family member has had seizures. You can reduce fever and pain by giving your child an aspirin-free pain reliever when the shot is given, and for the next 24 hours, following the package instructions. 6. What if there is a serious reaction? What should I look for? Look for anything that concerns you, such as signs of a severe allergic reaction, very high fever, or behavior changes. Signs of a severe allergic reaction can include hives, swelling of the face and throat, difficulty breathing, a fast heartbeat, dizziness, and weakness. These would start a few minutes to a few hours after the vaccination. What should I do?  If you think it is a severe allergic reaction or other emergency that can't wait, call 9-1-1 or get the person to the nearest hospital. Otherwise, call your doctor.  Afterward, the reaction should be reported to the Vaccine Adverse Event Reporting System (VAERS). Your doctor might file this report, or you can do it yourself through the VAERS web site at www.vaers.LAgents.no, or by calling 1-334-456-7431. ? VAERS is only for reporting reactions. They do not give medical advice. 7. The National Vaccine Injury Compensation Program The [employer] Vaccine Injury Compensation Program (VICP) is a federal program that was created to compensate  people who may have been injured by certain vaccines. Persons who believe they may have been injured by a vaccine can learn about the program and about filing a claim by calling 1-(734)727-4617 or visiting the VICP website at SpiritualWord.at. 8. How can I learn more?  Ask your doctor.  Call your local or state health department.  Contact the Centers for Disease Control and Prevention (CDC): ? Call 571-863-3988 (1-800-CDC-INFO) or ? Visit CDC's website at PicCapture.uy CDC DTaP Vaccine (Diphtheria, Tetanus, and Pertussis) VIS (10/24/05) This information is not intended to replace advice given to you by your health care provider. Make sure you discuss any questions you have with your health care provider. Document Released: 03/24/2006 Document Revised: 02/15/2016 Document Reviewed: 02/15/2016 Elsevier Interactive Patient Education  2017 Elsevier Inc.  Recombinant Zoster (Shingles) Vaccine, RZV: What You Need to Know 1. Why get vaccinated? Shingles (also called herpes zoster, or just zoster) is a painful skin rash, often with blisters. Shingles is caused by the varicella zoster virus, the same virus that causes chickenpox. After you have chickenpox, the virus  stays in your body and can cause shingles later in life. You can't catch shingles from another person. However, a person who has never had chickenpox (or chickenpox vaccine) could get chickenpox from someone with shingles. A shingles rash usually appears on one side of the face or body and heals within 2 to 4 weeks. Its main symptom is pain, which can be severe. Other symptoms can include fever, headache, chills and upset stomach. Very rarely, a shingles infection can lead to pneumonia, hearing problems, blindness, brain inflammation (encephalitis), or death. For about 1 person in 5, severe pain can continue even long after the rash has cleared up. This long-lasting pain is called post-herpetic neuralgia  (PHN). Shingles is far more common in people 42 years of age and older than in younger people, and the risk increases with age. It is also more common in people whose immune system is weakened because of a disease such as cancer, or by drugs such as steroids or chemotherapy. At least 1 million people a year in the [place] get shingles. 2. Shingles vaccine (recombinant) Recombinant shingles vaccine was approved by FDA in 2017 for the prevention of shingles. In clinical trials, it was more than 90% effective in preventing shingles. It can also reduce the likelihood of PHN. Two doses, 2 to 6 months apart, are recommended for adults 50 and older. This vaccine is also recommended for people who have already gotten the live shingles vaccine (Zostavax). There is no live virus in this vaccine. 3. Some people should not get this vaccine Tell your vaccine provider if you:  Have any severe, life-threatening allergies. A person who has ever had a life-threatening allergic reaction after a dose of recombinant shingles vaccine, or has a severe allergy to any component of this vaccine, may be advised not to be vaccinated. Ask your health care provider if you want information about vaccine components.  Are pregnant or breastfeeding. There is not much information about use of recombinant shingles vaccine in pregnant or nursing women. Your healthcare provider might recommend delaying vaccination.  Are not feeling well. If you have a mild illness, such as a cold, you can probably get the vaccine today. If you are moderately or severely ill, you should probably wait until you recover. Your doctor can advise you.  4. Risks of a vaccine reaction With any medicine, including vaccines, there is a chance of reactions. After recombinant shingles vaccination, a person might experience:  Pain, redness, soreness, or swelling at the site of the injection  Headache, muscle aches, fever, shivering, fatigue  In  clinical trials, most people got a sore arm with mild or moderate pain after vaccination, and some also had redness and swelling where they got the shot. Some people felt tired, had muscle pain, a headache, shivering, fever, stomach pain, or nausea. About 1 out of 6 people who got recombinant zoster vaccine experienced side effects that prevented them from doing regular activities. Symptoms went away on their own in about 2 to 3 days. Side effects were more common in younger people. You should still get the second dose of recombinant zoster vaccine even if you had one of these reactions after the first dose. Other things that could happen after this vaccine:  People sometimes faint after medical procedures, including vaccination. Sitting or lying down for about 15 minutes can help prevent fainting and injuries caused by a fall. Tell your provider if you feel dizzy or have vision changes or ringing in the ears.  Some people get shoulder pain that can be more severe and longer-lasting than routine soreness that can follow injections. This happens very rarely.  Any medication can cause a severe allergic reaction. Such reactions to a vaccine are estimated at about 1 in a million doses, and would happen within a few minutes to a few hours after the vaccination. As with any medicine, there is a very remote chance of a vaccine causing a serious injury or death. The safety of vaccines is always being monitored. For more information, visit: http://floyd.org/ 5. What if there is a serious problem? What should I look for?  Look for anything that concerns you, such as signs of a severe allergic reaction, very high fever, or unusual behavior. Signs of a severe allergic reaction can include hives, swelling of the face and throat, difficulty breathing, a fast heartbeat, dizziness, and weakness. These would usually start a few minutes to a few hours after the vaccination. What should I do?  If you think  it is a severe allergic reaction or other emergency that can't wait, call 9-1-1 and get to the nearest hospital. Otherwise, call your health care provider. Afterward, the reaction should be reported to the Vaccine Adverse Event Reporting System (VAERS). Your doctor should file this report, or you can do it yourself through the VAERS web site atwww.vaers.https://coleman.net/ by calling 518-145-9724. VAERS does not give medical advice. 6. How can I learn more?  Ask your healthcare provider. He or she can give you the vaccine package insert or suggest other sources of information.  Call your local or state health department.  Contact the Centers for Disease Control and Prevention (CDC): ? Call 5810015244 (1-800-CDC-INFO) or ? Visit the CDC's website at PicCapture.uy CDC Vaccine Information Statement (VIS) Recombinant Zoster Vaccine (07/22/2016) This information is not intended to replace advice given to you by your health care provider. Make sure you discuss any questions you have with your health care provider. Document Released: 08/06/2016 Document Revised: 08/06/2016 Document Reviewed: 08/06/2016 Elsevier Interactive Patient Education  [employer].

## 2017-12-23 NOTE — Progress Notes (Signed)
Pre visit review using our clinic review tool, if applicable. No additional management support is needed unless otherwise documented below in the visit note. 

## 2017-12-24 ENCOUNTER — Other Ambulatory Visit: Payer: Self-pay | Admitting: Internal Medicine

## 2017-12-24 DIAGNOSIS — R748 Abnormal levels of other serum enzymes: Secondary | ICD-10-CM

## 2017-12-24 LAB — LIPID PANEL
CHOL/HDL RATIO: 4
Cholesterol: 247 mg/dL — ABNORMAL HIGH (ref 0–200)
HDL: 68 mg/dL (ref >39.00)
LDL Cholesterol: 159 mg/dL — ABNORMAL HIGH (ref 0–99)
NONHDL: 179.17
Triglycerides: 102 mg/dL (ref 0.0–149.0)
VLDL: 20.4 mg/dL (ref 0.0–40.0)

## 2017-12-24 LAB — CBC WITH DIFFERENTIAL/PLATELET
BASOS PCT: 0.9 % (ref 0.0–3.0)
Basophils Absolute: 0.1 10*3/uL (ref 0.0–0.1)
EOS PCT: 1.5 % (ref 0.0–5.0)
Eosinophils Absolute: 0.1 10*3/uL (ref 0.0–0.7)
HCT: 40.3 % (ref 36.0–46.0)
HEMOGLOBIN: 13.9 g/dL (ref 12.0–15.0)
Lymphocytes Relative: 40.9 % (ref 12.0–46.0)
Lymphs Abs: 2.5 10*3/uL (ref 0.7–4.0)
MCHC: 34.5 g/dL (ref 30.0–36.0)
MCV: 90.6 fl (ref 78.0–100.0)
Monocytes Absolute: 0.4 10*3/uL (ref 0.1–1.0)
Monocytes Relative: 7.1 % (ref 3.0–12.0)
NEUTROS PCT: 49.6 % (ref 43.0–77.0)
Neutro Abs: 3 10*3/uL (ref 1.4–7.7)
Platelets: 253 10*3/uL (ref 150.0–400.0)
RBC: 4.44 Mil/uL (ref 3.87–5.11)
RDW: 13.7 % (ref 11.5–15.5)
WBC: 6 10*3/uL (ref 4.0–10.5)

## 2017-12-24 LAB — URINALYSIS, ROUTINE W REFLEX MICROSCOPIC
BILIRUBIN UA: NEGATIVE
GLUCOSE, UA: NEGATIVE
KETONES UA: NEGATIVE
Leukocytes, UA: NEGATIVE
Nitrite, UA: NEGATIVE
PROTEIN UA: NEGATIVE
RBC, UA: NEGATIVE
SPEC GRAV UA: 1.021 (ref 1.005–1.030)
UUROB: 1 mg/dL (ref 0.2–1.0)
pH, UA: 5.5 (ref 5.0–7.5)

## 2017-12-24 LAB — COMPREHENSIVE METABOLIC PANEL
ALK PHOS: 149 U/L — AB (ref 39–117)
ALT: 28 U/L (ref 0–35)
AST: 23 U/L (ref 0–37)
Albumin: 4.8 g/dL (ref 3.5–5.2)
BUN: 29 mg/dL — ABNORMAL HIGH (ref 6–23)
CHLORIDE: 101 meq/L (ref 96–112)
CO2: 28 mEq/L (ref 19–32)
Calcium: 10.9 mg/dL — ABNORMAL HIGH (ref 8.4–10.5)
Creatinine, Ser: 0.99 mg/dL (ref 0.40–1.20)
GFR: 62.71 mL/min (ref >60.00)
GLUCOSE: 88 mg/dL (ref 70–99)
POTASSIUM: 4.2 meq/L (ref 3.5–5.1)
Sodium: 138 mEq/L (ref 135–145)
TOTAL PROTEIN: 7.5 g/dL (ref 6.0–8.3)
Total Bilirubin: 0.5 mg/dL (ref 0.2–1.2)

## 2017-12-24 LAB — MEASLES/MUMPS/RUBELLA IMMUNITY
MUMPS IGG: 14.7 [AU]/ml
Rubella: <0.9 index — ABNORMAL LOW

## 2017-12-24 LAB — TSH: TSH: 1.87 u[IU]/mL (ref 0.35–4.50)

## 2017-12-24 LAB — HEPATITIS B SURFACE ANTIBODY, QUANTITATIVE: Hepatitis B-Post: <5 m[IU]/mL — ABNORMAL LOW (ref >=10)

## 2017-12-24 LAB — T4, FREE: Free T4: 0.86 ng/dL (ref 0.60–1.60)

## 2017-12-24 LAB — HEMOGLOBIN A1C: HEMOGLOBIN A1C: 5.4 % (ref 4.6–6.5)

## 2017-12-24 LAB — VITAMIN D 25 HYDROXY (VIT D DEFICIENCY, FRACTURES): VITD: 46.49 ng/mL (ref 30.00–100.00)

## 2017-12-26 ENCOUNTER — Other Ambulatory Visit: Payer: Self-pay

## 2017-12-26 MED ORDER — DULOXETINE HCL 30 MG PO CPEP: 90.0000 mg | ORAL_CAPSULE | Freq: Every day | ORAL | 1 refills | Status: DC

## 2017-12-26 NOTE — Addendum Note (Signed)
Addended by: Quentin OreMCLEAN-SCOCUZZA, TRACY on: 12/26/2017 04:53 PM   Modules accepted: Orders

## 2018-01-02 ENCOUNTER — Other Ambulatory Visit (INDEPENDENT_AMBULATORY_CARE_PROVIDER_SITE_OTHER): Payer: 59

## 2018-01-02 ENCOUNTER — Telehealth: Payer: Self-pay

## 2018-01-02 ENCOUNTER — Other Ambulatory Visit: Payer: Self-pay | Admitting: Internal Medicine

## 2018-01-02 DIAGNOSIS — F329 Major depressive disorder, single episode, unspecified: Secondary | ICD-10-CM

## 2018-01-02 DIAGNOSIS — F32A Depression, unspecified: Secondary | ICD-10-CM

## 2018-01-02 DIAGNOSIS — R748 Abnormal levels of other serum enzymes: Secondary | ICD-10-CM | POA: Diagnosis not present

## 2018-01-02 LAB — GAMMA GT: GGT: 35 U/L (ref 7–51)

## 2018-01-02 MED ORDER — DULOXETINE HCL 30 MG PO CPEP: 30.0000 mg | ORAL_CAPSULE | Freq: Every day | ORAL | 3 refills | Status: DC

## 2018-01-02 MED ORDER — DULOXETINE HCL 60 MG PO CPEP: 60.0000 mg | ORAL_CAPSULE | Freq: Every day | ORAL | 3 refills | Status: DC

## 2018-01-02 NOTE — Telephone Encounter (Signed)
Patient almost out of cymbalta. Insurance will not pay for patyient to take three pills but will pay for two.  Is there a way that we can change medication to one 60mg  and one 30mg  daily to equal 90mg ?  PA is being difficult to receive for the three 30mg  daily RX.  Please advise.

## 2018-01-05 LAB — PROTEIN ELECTROPHORESIS, URINE REFLEX
ALBUMIN ELP UR: 100 %
Alpha-1-Globulin, U: 0 %
Alpha-2-Globulin, U: 0 %
BETA GLOBULIN, U: 0 %
GAMMA GLOBULIN, U: 0 %
Protein, Ur: 9 mg/dL

## 2018-01-05 LAB — PARATHYROID HORMONE, INTACT (NO CA): PTH: 90 pg/mL — ABNORMAL HIGH (ref 14–64)

## 2018-01-06 LAB — PROTEIN ELECTROPHORESIS, SERUM
Albumin ELP: 4.5 g/dL (ref 3.8–4.8)
Alpha 1: 0.3 g/dL (ref 0.2–0.3)
Alpha 2: 0.6 g/dL (ref 0.5–0.9)
BETA 2: 0.3 g/dL (ref 0.2–0.5)
BETA GLOBULIN: 0.5 g/dL (ref 0.4–0.6)
Gamma Globulin: 0.7 g/dL — ABNORMAL LOW (ref 0.8–1.7)
TOTAL PROTEIN: 6.9 g/dL (ref 6.1–8.1)

## 2018-01-06 LAB — PTH-RELATED PEPTIDE: PTH-Related Protein (PTH-RP): 9 pg/mL — ABNORMAL LOW (ref 14–27)

## 2018-01-08 ENCOUNTER — Encounter: Payer: Self-pay | Admitting: Internal Medicine

## 2018-01-08 ENCOUNTER — Encounter: Payer: Self-pay | Admitting: *Deleted

## 2018-01-12 ENCOUNTER — Other Ambulatory Visit: Payer: Self-pay | Admitting: Internal Medicine

## 2018-01-20 ENCOUNTER — Ambulatory Visit
Admission: RE | Admit: 2018-01-20 | Discharge: 2018-01-20 | Disposition: A | Discharge status: Home / Self Care | Payer: 59 | Source: Ambulatory Visit | Attending: Internal Medicine | Admitting: Internal Medicine

## 2018-01-20 DIAGNOSIS — Z1231 Encounter for screening mammogram for malignant neoplasm of breast: Secondary | ICD-10-CM | POA: Diagnosis not present

## 2018-02-05 ENCOUNTER — Ambulatory Visit: Payer: 59 | Admitting: Internal Medicine

## 2018-02-05 ENCOUNTER — Encounter: Payer: Self-pay | Admitting: Internal Medicine

## 2018-02-05 VITALS — BP 118/70 | HR 94 | Temp 97.8°F | Ht 64.0 in | Wt 182.6 lb

## 2018-02-05 DIAGNOSIS — J309 Allergic rhinitis, unspecified: Secondary | ICD-10-CM | POA: Diagnosis not present

## 2018-02-05 DIAGNOSIS — H6993 Unspecified Eustachian tube disorder, bilateral: Secondary | ICD-10-CM | POA: Diagnosis not present

## 2018-02-05 MED ORDER — FLUTICASONE PROPIONATE 50 MCG/ACT NA SUSP: 2.0000 | Freq: Every day | NASAL | 6 refills | Status: DC

## 2018-02-05 MED ORDER — AZITHROMYCIN 250 MG PO TABS: ORAL_TABLET | ORAL | 0 refills | Status: DC

## 2018-02-05 NOTE — Progress Notes (Signed)
Pre visit review using our clinic review tool, if applicable. No additional management support is needed unless otherwise documented below in the visit note. 

## 2018-02-05 NOTE — Progress Notes (Signed)
Chief Complaint  Patient presents with  . Ear Pain   F/u  Ears feel clogged and congested has dry cough and hoarse no sinus issues h/o allergies  Review of Systems  Constitutional: Negative for weight loss.  HENT: Positive for hearing loss. Negative for ear pain.   Respiratory: Negative for cough and sputum production.   Cardiovascular: Negative for chest pain.  Endo/Heme/Allergies: Negative for environmental allergies.   Past Medical History:  Diagnosis Date  . Allergy   . Depression   . History of blood transfusion    after hysterectomy complication in 1998 where bladder laceration and severe infection   . History of chickenpox   . Kidney stone   . Kidney stones   . Migraines   . UTI (urinary tract infection)    recurrent x 5 not had since 2014    Past Surgical History:  Procedure Laterality Date  . ABDOMINAL HYSTERECTOMY     reason DUB; no h/o abnormal Pap. Dr. Leslye [facility] still has ovaries and FT intact not cervix   . CESAREAN SECTION     1994  . CHOLECYSTECTOMY     1993  . CYSTOSCOPY WITH STENT PLACEMENT Left 09/12/2016   Procedure: CYSTOSCOPY WITH STENT PLACEMENT;  Surgeon: Hildred Laser, MD;  Location: [facility];  Service: Urology;  Laterality: Left;  . KIDNEY STONE SURGERY    . MENISCUS REPAIR     b/l knees   . thermal hydroablation     DUB  . TONSILLECTOMY    . URETEROSCOPY WITH HOLMIUM LASER LITHOTRIPSY Left 09/12/2016   Procedure: URETEROSCOPY WITH HOLMIUM LASER LITHOTRIPSY;  Surgeon: Hildred Laser, MD;  Location: [facility];  Service: Urology;  Laterality: Left;   Family History  Problem Relation Age of Onset  . Arthritis Mother   . Depression Mother   . Diabetes Mother   . Hearing loss Mother   . Stroke Mother   . Early death Father   . Heart disease Father        died age 77 mi   . Hyperlipidemia Father   . Hypertension Father   . COPD Sister   . Depression Sister   . Diabetes Sister   . Early death Sister   . Hyperlipidemia Sister   .  Hypertension Sister   . Cancer Maternal Grandfather        lung smoker   . Heart disease Paternal Grandfather   . Depression Sister   . Diabetes Sister   . Diabetes Sister   . Prostate cancer Neg Hx   . Kidney cancer Neg Hx   . Bladder Cancer Neg Hx    Social History   Socioeconomic History  . Marital status: Married    Spouse name: Not on file  . Number of children: Not on file  . Years of education: Not on file  . Highest education level: Not on file  Occupational History  . Not on file  Social Needs  . Financial resource strain: Not on file  . Food insecurity:    Worry: Not on file    Inability: Not on file  . Transportation needs:    Medical: Not on file    Non-medical: Not on file  Tobacco Use  . Smoking status: Never Smoker  . Smokeless tobacco: Never Used  Substance and Sexual Activity  . Alcohol use: Yes  . Drug use: No  . Sexual activity: Not on file  Lifestyle  . Physical activity:    Days  per week: Not on file    Minutes per session: Not on file  . Stress: Not on file  Relationships  . Social connections:    Talks on phone: Not on file    Gets together: Not on file    Attends religious service: Not on file    Active member of club or organization: Not on file    Attends meetings of clubs or organizations: Not on file    Relationship status: Not on file  . Intimate partner violence:    Fear of current or ex partner: Not on file    Emotionally abused: Not on file    Physically abused: Not on file    Forced sexual activity: Not on file  Other Topics Concern  . Not on file  Social History Narrative   Married    2 girls 1 boy    Public house manager    Never smoker    No guns    Safe in relationship    Current Meds  Medication Sig  . Cholecalciferol (EQL VITAMIN D3 GUMMIES PO) Take 2 each by mouth daily.   . DULoxetine (CYMBALTA) 30 MG capsule Take 1 capsule (30 mg total) by mouth daily. +60=90 mg qd  . DULoxetine (CYMBALTA) 60 MG capsule Take 1  capsule (60 mg total) by mouth daily. +30=90 mg daily  . MELATONIN PO Take by mouth.   Allergies  Allergen Reactions  . Penicillins Shortness Of Breath    rash   Recent Results (from the past 2160 hour(s))  Comprehensive metabolic panel     Status: Abnormal   Collection Time: 12/23/17  4:08 PM  Result Value Ref Range   Sodium 138 135 - 145 mEq/L   Potassium 4.2 3.5 - 5.1 mEq/L   Chloride 101 96 - 112 mEq/L   CO2 28 19 - 32 mEq/L   Glucose, Bld 88 70 - 99 mg/dL   BUN 29 (H) 6 - 23 mg/dL   Creatinine, Ser 4.09 0.40 - 1.20 mg/dL   Total Bilirubin 0.5 0.2 - 1.2 mg/dL   Alkaline Phosphatase 149 (H) 39 - 117 U/L   AST 23 0 - 37 U/L   ALT 28 0 - 35 U/L   Total Protein 7.5 6.0 - 8.3 g/dL   Albumin 4.8 3.5 - 5.2 g/dL   Calcium 81.1 (H) 8.4 - 10.5 mg/dL   GFR 91.47 >82.95 mL/min  CBC with Differential/Platelet     Status: None   Collection Time: 12/23/17  4:08 PM  Result Value Ref Range   WBC 6.0 4.0 - 10.5 K/uL   RBC 4.44 3.87 - 5.11 Mil/uL   Hemoglobin 13.9 12.0 - 15.0 g/dL   HCT 62.1 30.8 - 65.7 %   MCV 90.6 78.0 - 100.0 fl   MCHC 34.5 30.0 - 36.0 g/dL   RDW 84.6 96.2 - 95.2 %   Platelets 253.0 150.0 - 400.0 K/uL   Neutrophils Relative % 49.6 43.0 - 77.0 %   Lymphocytes Relative 40.9 12.0 - 46.0 %   Monocytes Relative 7.1 3.0 - 12.0 %   Eosinophils Relative 1.5 0.0 - 5.0 %   Basophils Relative 0.9 0.0 - 3.0 %   Neutro Abs 3.0 1.4 - 7.7 K/uL   Lymphs Abs 2.5 0.7 - 4.0 K/uL   Monocytes Absolute 0.4 0.1 - 1.0 K/uL   Eosinophils Absolute 0.1 0.0 - 0.7 K/uL   Basophils Absolute 0.1 0.0 - 0.1 K/uL  Lipid panel     Status: Abnormal  Collection Time: 12/23/17  4:08 PM  Result Value Ref Range   Cholesterol 247 (H) 0 - 200 mg/dL    Comment: ATP III Classification       Desirable:  < 200 mg/dL               Borderline High:  200 - 239 mg/dL          High:  > = 161 mg/dL   Triglycerides 096.0 0.0 - 149.0 mg/dL    Comment: Normal:  <454 mg/dLBorderline High:  150 - 199 mg/dL   HDL  09.81 >19.14 mg/dL   VLDL 78.2 0.0 - 95.6 mg/dL   LDL Cholesterol 213 (H) 0 - 99 mg/dL   Total CHOL/HDL Ratio 4     Comment:                Men          Women1/2 Average Risk     3.4          3.3Average Risk          5.0          4.42X Average Risk          9.6          7.13X Average Risk          15.0          11.0                       NonHDL 179.17     Comment: NOTE:  Non-HDL goal should be 30 mg/dL higher than patient's LDL goal (i.e. LDL goal of < 70 mg/dL, would have non-HDL goal of < 100 mg/dL)  Urinalysis, Routine w reflex microscopic     Status: None   Collection Time: 12/23/17  4:08 PM  Result Value Ref Range   Specific Gravity, UA 1.021 1.005 - 1.030   pH, UA 5.5 5.0 - 7.5   Color, UA Yellow Yellow   Appearance Ur Clear Clear   Leukocytes, UA Negative Negative   Protein, UA Negative Negative/Trace   Glucose, UA Negative Negative   Ketones, UA Negative Negative   RBC, UA Negative Negative   Bilirubin, UA Negative Negative   Urobilinogen, Ur 1.0 0.2 - 1.0 mg/dL   Nitrite, UA Negative Negative   Microscopic Examination Comment     Comment: Microscopic not indicated and not performed.  TSH     Status: None   Collection Time: 12/23/17  4:08 PM  Result Value Ref Range   TSH 1.87 0.35 - 4.50 uIU/mL  T4, free     Status: None   Collection Time: 12/23/17  4:08 PM  Result Value Ref Range   Free T4 0.86 0.60 - 1.60 ng/dL    Comment: Specimens from patients who are undergoing biotin therapy and /or ingesting biotin supplements may contain high levels of biotin.  The higher biotin concentration in these specimens interferes with this Free T4 assay.  Specimens that contain high levels  of biotin may cause false high results for this Free T4 assay.  Please interpret results in light of the total clinical presentation of the patient.    Vitamin D (25 hydroxy)     Status: None   Collection Time: 12/23/17  4:08 PM  Result Value Ref Range   VITD 46.49 30.00 - 100.00 ng/mL  Hemoglobin  A1c     Status: None   Collection Time: 12/23/17  4:08 PM  Result Value Ref Range   Hgb A1c MFr Bld 5.4 4.6 - 6.5 %    Comment: Glycemic Control Guidelines for People with Diabetes:Non Diabetic:  <6%Goal of Therapy: <7%Additional Action Suggested:  >8%   Hepatitis B surface antibody     Status: Abnormal   Collection Time: 12/23/17  4:08 PM  Result Value Ref Range   Hepatitis B-Post <5 (L) > OR = 10 mIU/mL    Comment: . Patient does not have immunity to hepatitis B virus. . For additional information, please refer to http://education.[facility].com/faq/FAQ105 (This link is being provided for informational/ educational purposes only).   Measles/Mumps/Rubella Immunity     Status: Abnormal   Collection Time: 12/23/17  4:08 PM  Result Value Ref Range   Rubeola IgG <25.00 (L) AU/mL    Comment: AU/mL            Interpretation -----            -------------- <25.00           Negative 25.00-29.99      Equivocal >29.99           Positive . A positive result indicates that the patient has antibody to measles virus. It does not differentiate  between an active or past infection. The clinical  diagnosis must be interpreted in conjunction with  clinical signs and symptoms of the patient.    Mumps IgG 14.70 AU/mL    Comment:  AU/mL           Interpretation -------         ---------------- <9.00             Negative 9.00-10.99        Equivocal >10.99            Positive A positive result indicates that the patient has  antibody to mumps virus. It does not differentiate between an  active or past infection. The clinical diagnosis must be interpreted in conjunction with clinical signs and symptoms of the patient. .    Rubella <0.90 (L) index    Comment:     Index            Interpretation     -----            --------------       <0.90            Not consistent with Immunity     0.90-0.99        Equivocal     > or = 1.00      Consistent with Immunity  . The presence of  rubella IgG antibody suggests  immunization or past or current infection with rubella virus.   Gamma GT     Status: None   Collection Time: 01/02/18 11:14 AM  Result Value Ref Range   GGT 35 7 - 51 U/L  PTH-Related Peptide     Status: Abnormal   Collection Time: 01/02/18 11:14 AM  Result Value Ref Range   PTH-Related Protein (PTH-RP) 9 (L) 14 - 27 pg/mL    Comment: . This is a C-terminal PTH-RP assay. PTH-RP is useful in the differential diagnosis of hypercalcemia and levels may be elevated in patients with tumor-associated hypercalcemia. Elevated results may also be observed in patients with renal disease. . This test was developed and its analytical performance characteristics have been determined by [facility]. It has not been cleared or approved by FDA. This assay has  been validated pursuant to the CLIA regulations and is used for clinical purposes.   PTH, intact (no Ca)     Status: Abnormal   Collection Time: 01/02/18 11:14 AM  Result Value Ref Range   PTH 90 (H) 14 - 64 pg/mL    Comment: . Interpretive Guide    Intact PTH           Calcium ------------------    ----------           ------- Normal Parathyroid    Normal               Normal Hypoparathyroidism    Low or Low Normal    Low Hyperparathyroidism    Primary            Normal or High       High    Secondary          High                 Normal or Low    Tertiary           High                 High Non-Parathyroid    Hypercalcemia      Low or Low Normal    High .   Protein Electrophoresis, Urine Rflx.     Status: None   Collection Time: 01/02/18 11:14 AM  Result Value Ref Range   Protein, Ur 9.0 Not Estab. mg/dL   Albumin ELP, Urine 161.0100.0 %   Alpha-1-Globulin, U 0.0 %   Alpha-2-Globulin, U 0.0 %   Beta Globulin, U 0.0 %   Gamma Globulin, U 0.0 %   M Component, Ur Not Observed Not Observed %   Please Note: Comment     Comment: Protein electrophoresis scan  will follow via computer, mail, or courier delivery.   Protein Electrophoresis, (serum)     Status: Abnormal   Collection Time: 01/02/18 11:14 AM  Result Value Ref Range   Total Protein 6.9 6.1 - 8.1 g/dL   Albumin ELP 4.5 3.8 - 4.8 g/dL   Alpha 1 0.3 0.2 - 0.3 g/dL   Alpha 2 0.6 0.5 - 0.9 g/dL   Beta Globulin 0.5 0.4 - 0.6 g/dL   Beta 2 0.3 0.2 - 0.5 g/dL   Gamma Globulin 0.7 (L) 0.8 - 1.7 g/dL   SPE Interp.      Comment: . Consistent with hypogammaglobulinemia. Serum free light  chains or urine immunofixation should be considered if  plasma cell dyscrasias are a possible clinical  diagnosis. .    Objective  Body mass index is 31.34 kg/m. Wt Readings from Last 3 Encounters:  02/05/18 182 lb 9.6 oz (82.8 kg)  12/23/17 176 lb 9.6 oz (80.1 kg)  10/18/16 172 lb 3.2 oz (78.1 kg)   Temp Readings from Last 3 Encounters:  02/05/18 97.8 F (36.6 C) (Oral)  12/23/17 98 F (36.7 C) (Oral)  09/12/16 98.1 F (36.7 C)   BP Readings from Last 3 Encounters:  02/05/18 118/70  12/23/17 136/84  10/18/16 130/75   Pulse Readings from Last 3 Encounters:  02/05/18 94  12/23/17 98  10/18/16 (!) 101    Physical Exam  Constitutional: She is oriented to person, place, and time. Vital signs are normal. She appears well-developed and well-nourished. She is cooperative.  HENT:  Head: Normocephalic and atraumatic.  Mouth/Throat: Oropharynx is clear and moist and mucous membranes are normal.  B/l fluid in ears   Eyes: Pupils are equal, round, and reactive to light. Conjunctivae are normal.  Cardiovascular: Normal rate, regular rhythm and normal heart sounds.  Pulmonary/Chest: Effort normal and breath sounds normal.  Neurological: She is alert and oriented to person, place, and time. Gait normal.  Skin: Skin is warm, dry and intact.  Psychiatric: She has a normal mood and affect. Her speech is normal and behavior is normal. Judgment and thought content normal. Cognition and memory are  normal.  Nursing note and vitals reviewed.   Assessment   1. Eustachian tube dysfunction b/l, allergic rhinitis  Dry cough c/w start of URI  Plan   1. NS, flonase, antihistamine with D x 3 days prn otherwise may need prn antihistamine  May get zpack and take prn if sx's getting worse   Pending appt endocrine 04/2018 f/u hypercalcemia  mammo neg 01/20/18   Provider: Dr. French Ana McLean-Scocuz[facility]

## 2018-02-05 NOTE — Patient Instructions (Signed)
Nasal saline 1st 2 sprays then flonase  Zyrtec or Claritin or Allegra regular and D=decongestion formulation x 3 days if needed regular formulation can take after 3 days  Zpack antibiotic only if needed   Eustachian Tube Dysfunction The eustachian tube connects the middle ear to the back of the nose. It regulates air pressure in the middle ear by allowing air to move between the ear and nose. It also helps to drain fluid from the middle ear space. When the eustachian tube does not function properly, air pressure, fluid, or both can build up in the middle ear. Eustachian tube dysfunction can affect one or both ears. What are the causes? This condition happens when the eustachian tube becomes blocked or cannot open normally. This may result from:  Ear infections.  Colds and other upper respiratory infections.  Allergies.  Irritation, such as from cigarette smoke or acid from the stomach coming up into the esophagus (gastroesophageal reflux).  Sudden changes in air pressure, such as from descending in an airplane.  Abnormal growths in the nose or throat, such as nasal polyps, tumors, or enlarged tissue at the back of the throat (adenoids).  What increases the risk? This condition may be more likely to develop in people who smoke and people who are overweight. Eustachian tube dysfunction may also be more likely to develop in children, especially children who have:  Certain birth defects of the mouth, such as cleft palate.  Large tonsils and adenoids.  What are the signs or symptoms? Symptoms of this condition may include:  A feeling of fullness in the ear.  Ear pain.  Clicking or popping noises in the ear.  Ringing in the ear.  Hearing loss.  Loss of balance.  Symptoms may get worse when the air pressure around you changes, such as when you travel to an area of high elevation or fly on an airplane. How is this diagnosed? This condition may be diagnosed based on:  Your  symptoms.  A physical exam of your ear, nose, and throat.  Tests, such as those that measure: ? The movement of your eardrum (tympanogram). ? Your hearing (audiometry).  How is this treated? Treatment depends on the cause and severity of your condition. If your symptoms are mild, you may be able to relieve your symptoms by moving air into ("popping") your ears. If you have symptoms of fluid in your ears, treatment may include:  Decongestants.  Antihistamines.  Nasal sprays or ear drops that contain medicines that reduce swelling (steroids).  In some cases, you may need to have a procedure to drain the fluid in your eardrum (myringotomy). In this procedure, a small tube is placed in the eardrum to:  Drain the fluid.  Restore the air in the middle ear space.  Follow these instructions at home:  Take over-the-counter and prescription medicines only as told by your health care provider.  Use techniques to help pop your ears as recommended by your health care provider. These may include: ? Chewing gum. ? Yawning. ? Frequent, forceful swallowing. ? Closing your mouth, holding your nose closed, and gently blowing as if you are trying to blow air out of your nose.  Do not do any of the following until your health care provider approves: ? Travel to high altitudes. ? Fly in airplanes. ? Work in a Estate agentpressurized cabin or room. ? Scuba dive.  Keep your ears dry. Dry your ears completely after showering or bathing.  Do not smoke.  Keep all  follow-up visits as told by your health care provider. This is important. Contact a health care provider if:  Your symptoms do not go away after treatment.  Your symptoms come back after treatment.  You are unable to pop your ears.  You have: ? A fever. ? Pain in your ear. ? Pain in your head or neck. ? Fluid draining from your ear.  Your hearing suddenly changes.  You become very dizzy.  You lose your balance. This information is  not intended to replace advice given to you by your health care provider. Make sure you discuss any questions you have with your health care provider. Document Released: 06/23/2015 Document Revised: 11/02/2015 Document Reviewed: 06/15/2014 Elsevier Interactive Patient Education  [employer].

## 2018-04-29 ENCOUNTER — Encounter: Payer: Self-pay | Admitting: Internal Medicine

## 2018-04-29 ENCOUNTER — Ambulatory Visit: Payer: 59 | Admitting: Internal Medicine

## 2018-04-29 VITALS — BP 146/96 | HR 89 | Temp 98.2°F | Ht 64.0 in | Wt 190.4 lb

## 2018-04-29 DIAGNOSIS — Z23 Encounter for immunization: Secondary | ICD-10-CM

## 2018-04-29 DIAGNOSIS — J309 Allergic rhinitis, unspecified: Secondary | ICD-10-CM | POA: Diagnosis not present

## 2018-04-29 DIAGNOSIS — R42 Dizziness and giddiness: Secondary | ICD-10-CM

## 2018-04-29 DIAGNOSIS — E213 Hyperparathyroidism, unspecified: Secondary | ICD-10-CM | POA: Diagnosis not present

## 2018-04-29 DIAGNOSIS — I10 Essential (primary) hypertension: Secondary | ICD-10-CM | POA: Diagnosis not present

## 2018-04-29 MED ORDER — MONTELUKAST SODIUM 10 MG PO TABS: 10.0000 mg | ORAL_TABLET | Freq: Every day | ORAL | 0 refills | Status: DC

## 2018-04-29 MED ORDER — AMLODIPINE BESYLATE 2.5 MG PO TABS: 2.5000 mg | ORAL_TABLET | Freq: Every day | ORAL | 0 refills | Status: DC

## 2018-04-29 NOTE — Patient Instructions (Addendum)
Ref. Range 12/23/2017 16:08  Rubella Latest Units: index <0.90 (L)  Hepatitis B-Post Latest Ref Range: > OR = 10 mIU/mL <5 (L)  Mumps IgG Latest Units: AU/mL 14.70  Rubeola IgG Latest Units: AU/mL <25.00 (L)   [facility] [phone] for MMR vaccine if wanted or your pharmacy  We gave you hep B vaccine today    Parathyroidectomy A parathyroidectomy is surgery to remove one or more parathyroid glands. These glands are in your neck. They produce a hormone (parathyroid hormone) that helps to control the level of calcium in your body. Each gland is very small, about the size of a pea. Most people have four parathyroid glands. You may have a parathyroidectomy if your body produces too much parathyroid hormone (hyperparathyroidism). This is usually caused by one or more of your parathyroid glands becoming enlarged from a type of noncancerous tumor (adenoma). One of four methods may be used for a parathyroidectomy. An open, minimally invasive, video-assisted, or endoscopic procedure may be done. Some steps of the procedure will vary depending on the method being used. Tell a health care provider about:  Any allergies you have.  All medicines you are taking, including vitamins, herbs, eye drops, creams, and over-the-counter medicines.  Any problems you or family members have had with anesthetic medicines.  Any blood disorders you have.  Any surgeries you have had.  Any medical conditions you have. What are the risks? Generally, this is a safe procedure. However, problems can occur and include:  Excessive bleeding.  Infection near the incision.  Slow healing.  Pooling of blood under the skin in the incision area (hematoma).  Blood clots.  Damage to: ? The nerves in your neck. ? Skin (scarring). ? Surrounding blood vessels.  Need for additional surgery.  A hoarse or weak voice.  A condition in which your body does not make enough parathyroid hormone (hypoparathyroidism). This is  rare.  Difficulty breathing. This is rare.  What happens before the procedure?  Ask your health care provider: ? About changing or stopping your regular medicines. This is especially important if you are taking diabetes medicines or blood thinners. ? If you can take medicines such as aspirin and ibuprofen. These medicines can thin your blood. Do not take these medicines if your health care provider directs you not to.  Do not eat or drink anything after midnight on the night before the procedure or as directed by your health care provider.  You might be asked to shower or wash with an antibacterial soap.  Plan to have someone take you home after the procedure. What happens during the procedure?  An IV tube will be inserted into a vein in your hand or arm. Medicine will flow through this tube directly into your body.  You may be given a medicine to help you relax (sedative).  You will be given a medicine that makes you go to sleep (general anesthetic).  As soon as you are asleep, the surgeon will make the incisions needed for the type of procedure that you are having. ? Open parathyroidectomy. For this procedure, the surgeon will make a single incision in the center of your neck. The incision will be about 2-4 inches long. ? Minimally invasive parathyroidectomy. For this procedure, the surgeon will make one small incision in the side of your neck. This smaller incision will be about 1-2 inches long. Before the procedure, you might be given an injection of a type of medicine that will help the  surgeon to locate the gland. ? Video-assisted parathyroidectomy. For this procedure, two small incisions will be made in your neck. One incision is for the instruments that will be used to remove the gland. The other incision is for a tiny camera that helps the surgeon to see inside your neck. ? Endoscopic parathyroidectomy. For this procedure, the surgeon uses a tool that is like a small, flexible,  tubelike telescope (endoscope). The endoscope is inserted through an incision that is made just above your collarbone. This method reduces the scarring and pain of a parathyroidectomy.  The surgeon will remove the gland or glands that are causing problems and then close the incisions using stitches or other methods. The stitches are often hidden under the skin. What happens after the procedure?  Your blood pressure, heart rate, breathing rate, and blood oxygen level will be monitored often until the medicines you were given have worn off.  Your blood will be tested to check the calcium level in your body.  You may have numbness around your mouth or in your fingers or toes for a day or two after the surgery. This is caused by a low level of calcium. You may have to take calcium supplements. This information is not intended to replace advice given to you by your health care provider. Make sure you discuss any questions you have with your health care provider. Document Released: 08/23/2008 Document Revised: 11/02/2015 Document Reviewed: 10/20/2013 Elsevier Interactive Patient Education  2018 [employer].  Dizziness Dizziness is a common problem. It is a feeling of unsteadiness or light-headedness. You may feel like you are about to faint. Dizziness can lead to injury if you stumble or fall. Anyone can become dizzy, but dizziness is more common in older adults. This condition can be caused by a number of things, including medicines, dehydration, or illness. Follow these instructions at home: Eating and drinking  Drink enough fluid to keep your urine clear or pale yellow. This helps to keep you from becoming dehydrated. Try to drink more clear fluids, such as water.  Do not drink alcohol.  Limit your caffeine intake if told to do so by your health care provider. Check ingredients and nutrition facts to see if a food or beverage contains caffeine.  Limit your salt (sodium) intake if told to do  so by your health care provider. Check ingredients and nutrition facts to see if a food or beverage contains sodium. Activity  Avoid making quick movements. ? Rise slowly from chairs and steady yourself until you feel okay. ? In the morning, first sit up on the side of the bed. When you feel okay, stand slowly while you hold onto something until you know that your balance is fine.  If you need to stand in one place for a long time, move your legs often. Tighten and relax the muscles in your legs while you are standing.  Do not drive or use heavy machinery if you feel dizzy.  Avoid bending down if you feel dizzy. Place items in your home so that they are easy for you to reach without leaning over. Lifestyle  Do not use any products that contain nicotine or tobacco, such as cigarettes and e-cigarettes. If you need help quitting, ask your health care provider.  Try to reduce your stress level by using methods such as yoga or meditation. Talk with your health care provider if you need help to manage your stress. General instructions  Watch your dizziness for any changes.  Take over-the-counter and prescription medicines only as told by your health care provider. Talk with your health care provider if you think that your dizziness is caused by a medicine that you are taking.  Tell a friend or a family member that you are feeling dizzy. If he or she notices any changes in your behavior, have this person call your health care provider.  Keep all follow-up visits as told by your health care provider. This is important. Contact a health care provider if:  Your dizziness does not go away.  Your dizziness or light-headedness gets worse.  You feel nauseous.  You have reduced hearing.  You have new symptoms.  You are unsteady on your feet or you feel like the room is spinning. Get help right away if:  You vomit or have diarrhea and are unable to eat or drink anything.  You have problems  talking, walking, swallowing, or using your arms, hands, or legs.  You feel generally weak.  You are not thinking clearly or you have trouble forming sentences. It may take a friend or family member to notice this.  You have chest pain, abdominal pain, shortness of breath, or sweating.  Your vision changes.  You have any bleeding.  You have a severe headache.  You have neck pain or a stiff neck.  You have a fever. These symptoms may represent a serious problem that is an emergency. Do not wait to see if the symptoms will go away. Get medical help right away. Call your local emergency services (911 in the [place] Do not drive yourself to the hospital. Summary  Dizziness is a feeling of unsteadiness or light-headedness. This condition can be caused by a number of things, including medicines, dehydration, or illness.  Anyone can become dizzy, but dizziness is more common in older adults.  Drink enough fluid to keep your urine clear or pale yellow. Do not drink alcohol.  Avoid making quick movements if you feel dizzy. Monitor your dizziness for any changes. This information is not intended to replace advice given to you by your health care provider. Make sure you discuss any questions you have with your health care provider. Document Released: 11/20/2000 Document Revised: 06/29/2016 Document Reviewed: 06/29/2016 Elsevier Interactive Patient Education  2018 [employer] Eating [employer] DASH stands for "Dietary Approaches to Stop Hypertension." The DASH eating plan is a healthy eating plan that has been shown to reduce high blood pressure (hypertension). It may also reduce your risk for type 2 diabetes, heart disease, and stroke. The DASH eating plan may also help with weight loss. What are tips for following this plan? General guidelines  Avoid eating more than 2,300 mg (milligrams) of salt (sodium) a day. If you have hypertension, you may need to reduce your sodium intake to  1,500 mg a day.  Limit alcohol intake to no more than 1 drink a day for nonpregnant women and 2 drinks a day for men. One drink equals 12 oz of beer, 5 oz of wine, or 1 oz of hard liquor.  Work with your health care provider to maintain a healthy body weight or to lose weight. Ask what an ideal weight is for you.  Get at least 30 minutes of exercise that causes your heart to beat faster (aerobic exercise) most days of the week. Activities may include walking, swimming, or biking.  Work with your health care provider or diet and nutrition specialist (dietitian) to adjust your eating plan to your  individual calorie needs. Reading food labels  Check food labels for the amount of sodium per serving. Choose foods with less than 5 percent of the Daily Value of sodium. Generally, foods with less than 300 mg of sodium per serving fit into this eating plan.  To find whole grains, look for the word "whole" as the first word in the ingredient list. Shopping  Buy products labeled as "low-sodium" or "no salt added."  Buy fresh foods. Avoid canned foods and premade or frozen meals. Cooking  Avoid adding salt when cooking. Use salt-free seasonings or herbs instead of table salt or sea salt. Check with your health care provider or pharmacist before using salt substitutes.  Do not fry foods. Cook foods using healthy methods such as baking, boiling, grilling, and broiling instead.  Cook with heart-healthy oils, such as olive, canola, soybean, or sunflower oil. Meal planning   Eat a balanced diet that includes: ? 5 or more servings of fruits and vegetables each day. At each meal, try to fill half of your plate with fruits and vegetables. ? Up to 6-8 servings of whole grains each day. ? Less than 6 oz of lean meat, poultry, or fish each day. A 3-oz serving of meat is about the same size as a deck of cards. One egg equals 1 oz. ? 2 servings of low-fat dairy each day. ? A serving of nuts, seeds, or  beans 5 times each week. ? Heart-healthy fats. Healthy fats called Omega-3 fatty acids are found in foods such as flaxseeds and coldwater fish, like sardines, salmon, and mackerel.  Limit how much you eat of the following: ? Canned or prepackaged foods. ? Food that is high in trans fat, such as fried foods. ? Food that is high in saturated fat, such as fatty meat. ? Sweets, desserts, sugary drinks, and other foods with added sugar. ? Full-fat dairy products.  Do not salt foods before eating.  Try to eat at least 2 vegetarian meals each week.  Eat more home-cooked food and less restaurant, buffet, and fast food.  When eating at a restaurant, ask that your food be prepared with less salt or no salt, if possible. What foods are recommended? The items listed may not be a complete list. Talk with your dietitian about what dietary choices are best for you. Grains Whole-grain or whole-wheat bread. Whole-grain or whole-wheat pasta. Brown rice. [place]. Bulgur. Whole-grain and low-sodium cereals. Pita bread. Low-fat, low-sodium crackers. Whole-wheat flour tortillas. Vegetables Fresh or frozen vegetables (raw, steamed, roasted, or grilled). Low-sodium or reduced-sodium tomato and vegetable juice. Low-sodium or reduced-sodium tomato sauce and tomato paste. Low-sodium or reduced-sodium canned vegetables. Fruits All fresh, dried, or frozen fruit. Canned fruit in natural juice (without added sugar). Meat and other protein foods Skinless chicken or [place]. Ground chicken or [place]. Pork with fat trimmed off. Fish and seafood. Egg whites. Dried beans, peas, or lentils. Unsalted nuts, nut butters, and seeds. Unsalted canned beans. Lean cuts of beef with fat trimmed off. Low-sodium, lean deli meat. Dairy Low-fat (1%) or fat-free (skim) milk. Fat-free, low-fat, or reduced-fat cheeses. Nonfat, low-sodium ricotta or cottage cheese. Low-fat or nonfat yogurt. Low-fat, low-sodium cheese. Fats and  oils Soft margarine without trans fats. Vegetable oil. Low-fat, reduced-fat, or light mayonnaise and salad dressings (reduced-sodium). Canola, safflower, olive, soybean, and sunflower oils. Avocado. Seasoning and other foods Herbs. Spices. Seasoning mixes without salt. Unsalted popcorn and pretzels. Fat-free sweets. What foods are not recommended? The items listed may not be  a complete list. Talk with your dietitian about what dietary choices are best for you. Grains Baked goods made with fat, such as croissants, muffins, or some breads. Dry pasta or rice meal packs. Vegetables Creamed or fried vegetables. Vegetables in a cheese sauce. Regular canned vegetables (not low-sodium or reduced-sodium). Regular canned tomato sauce and paste (not low-sodium or reduced-sodium). Regular tomato and vegetable juice (not low-sodium or reduced-sodium). Angie Fava. Olives. Fruits Canned fruit in a light or heavy syrup. Fried fruit. Fruit in cream or butter sauce. Meat and other protein foods Fatty cuts of meat. Ribs. Fried meat. Berniece Salines. Sausage. Bologna and other processed lunch meats. Salami. Fatback. Hotdogs. Bratwurst. Salted nuts and seeds. Canned beans with added salt. Canned or smoked fish. Whole eggs or egg yolks. Chicken or [place] with skin. Dairy Whole or 2% milk, cream, and half-and-half. Whole or full-fat cream cheese. Whole-fat or sweetened yogurt. Full-fat cheese. Nondairy creamers. Whipped toppings. Processed cheese and cheese spreads. Fats and oils Butter. Stick margarine. Lard. Shortening. Ghee. Bacon fat. Tropical oils, such as coconut, palm kernel, or palm oil. Seasoning and other foods Salted popcorn and pretzels. Onion salt, garlic salt, seasoned salt, table salt, and sea salt. Worcestershire sauce. Tartar sauce. Barbecue sauce. Teriyaki sauce. Soy sauce, including reduced-sodium. Steak sauce. Canned and packaged gravies. Fish sauce. Oyster sauce. Cocktail sauce. Horseradish that you find on the  shelf. Ketchup. Mustard. Meat flavorings and tenderizers. Bouillon cubes. Hot sauce and Tabasco sauce. Premade or packaged marinades. Premade or packaged taco seasonings. Relishes. Regular salad dressings. Where to find more information:  National Heart, Lung, and [place]: https://wilson-eaton.com/  American Heart Association: www.heart.org Summary  The DASH eating plan is a healthy eating plan that has been shown to reduce high blood pressure (hypertension). It may also reduce your risk for type 2 diabetes, heart disease, and stroke.  With the DASH eating plan, you should limit salt (sodium) intake to 2,300 mg a day. If you have hypertension, you may need to reduce your sodium intake to 1,500 mg a day.  When on the DASH eating plan, aim to eat more fresh fruits and vegetables, whole grains, lean proteins, low-fat dairy, and heart-healthy fats.  Work with your health care provider or diet and nutrition specialist (dietitian) to adjust your eating plan to your individual calorie needs. This information is not intended to replace advice given to you by your health care provider. Make sure you discuss any questions you have with your health care provider. Document Released: 05/16/2011 Document Revised: 05/20/2016 Document Reviewed: 05/20/2016 Elsevier Interactive Patient Education  2018 [employer].  Hypertension Hypertension, commonly called high blood pressure, is when the force of blood pumping through the arteries is too strong. The arteries are the blood vessels that carry blood from the heart throughout the body. Hypertension forces the heart to work harder to pump blood and may cause arteries to become narrow or stiff. Having untreated or uncontrolled hypertension can cause heart attacks, strokes, kidney disease, and other problems. A blood pressure reading consists of a higher number over a lower number. Ideally, your blood pressure should be below 120/80. The first ("top") number is  called the systolic pressure. It is a measure of the pressure in your arteries as your heart beats. The second ("bottom") number is called the diastolic pressure. It is a measure of the pressure in your arteries as the heart relaxes. What are the causes? The cause of this condition is not known. What increases the risk? Some risk factors for  high blood pressure are under your control. Others are not. Factors you can change  Smoking.  Having type 2 diabetes mellitus, high cholesterol, or both.  Not getting enough exercise or physical activity.  Being overweight.  Having too much fat, sugar, calories, or salt (sodium) in your diet.  Drinking too much alcohol. Factors that are difficult or impossible to change  Having chronic kidney disease.  Having a family history of high blood pressure.  Age. Risk increases with age.  Race. You may be at higher risk if you are African-American.  Gender. Men are at higher risk than women before age 24. After age 66, women are at higher risk than men.  Having obstructive sleep apnea.  Stress. What are the signs or symptoms? Extremely high blood pressure (hypertensive crisis) may cause:  Headache.  Anxiety.  Shortness of breath.  Nosebleed.  Nausea and vomiting.  Severe chest pain.  Jerky movements you cannot control (seizures).  How is this diagnosed? This condition is diagnosed by measuring your blood pressure while you are seated, with your arm resting on a surface. The cuff of the blood pressure monitor will be placed directly against the skin of your upper arm at the level of your heart. It should be measured at least twice using the same arm. Certain conditions can cause a difference in blood pressure between your right and left arms. Certain factors can cause blood pressure readings to be lower or higher than normal (elevated) for a short period of time:  When your blood pressure is higher when you are in a health care  provider's office than when you are at home, this is called white coat hypertension. Most people with this condition do not need medicines.  When your blood pressure is higher at home than when you are in a health care provider's office, this is called masked hypertension. Most people with this condition may need medicines to control blood pressure.  If you have a high blood pressure reading during one visit or you have normal blood pressure with other risk factors:  You may be asked to return on a different day to have your blood pressure checked again.  You may be asked to monitor your blood pressure at home for 1 week or longer.  If you are diagnosed with hypertension, you may have other blood or imaging tests to help your health care provider understand your overall risk for other conditions. How is this treated? This condition is treated by making healthy lifestyle changes, such as eating healthy foods, exercising more, and reducing your alcohol intake. Your health care provider may prescribe medicine if lifestyle changes are not enough to get your blood pressure under control, and if:  Your systolic blood pressure is above 130.  Your diastolic blood pressure is above 80.  Your personal target blood pressure may vary depending on your medical conditions, your age, and other factors. Follow these instructions at home: Eating and drinking  Eat a diet that is high in fiber and potassium, and low in sodium, added sugar, and fat. An example eating plan is called the DASH (Dietary Approaches to Stop Hypertension) diet. To eat this way: ? Eat plenty of fresh fruits and vegetables. Try to fill half of your plate at each meal with fruits and vegetables. ? Eat whole grains, such as whole wheat pasta, brown rice, or whole grain bread. Fill about one quarter of your plate with whole grains. ? Eat or drink low-fat dairy products, such as  skim milk or low-fat yogurt. ? Avoid fatty cuts of meat,  processed or cured meats, and poultry with skin. Fill about one quarter of your plate with lean proteins, such as fish, chicken without skin, beans, eggs, and tofu. ? Avoid premade and processed foods. These tend to be higher in sodium, added sugar, and fat.  Reduce your daily sodium intake. Most people with hypertension should eat less than 1,500 mg of sodium a day.  Limit alcohol intake to no more than 1 drink a day for nonpregnant women and 2 drinks a day for men. One drink equals 12 oz of beer, 5 oz of wine, or 1 oz of hard liquor. Lifestyle  Work with your health care provider to maintain a healthy body weight or to lose weight. Ask what an ideal weight is for you.  Get at least 30 minutes of exercise that causes your heart to beat faster (aerobic exercise) most days of the week. Activities may include walking, swimming, or biking.  Include exercise to strengthen your muscles (resistance exercise), such as pilates or lifting weights, as part of your weekly exercise routine. Try to do these types of exercises for 30 minutes at least 3 days a week.  Do not use any products that contain nicotine or tobacco, such as cigarettes and e-cigarettes. If you need help quitting, ask your health care provider.  Monitor your blood pressure at home as told by your health care provider.  Keep all follow-up visits as told by your health care provider. This is important. Medicines  Take over-the-counter and prescription medicines only as told by your health care provider. Follow directions carefully. Blood pressure medicines must be taken as prescribed.  Do not skip doses of blood pressure medicine. Doing this puts you at risk for problems and can make the medicine less effective.  Ask your health care provider about side effects or reactions to medicines that you should watch for. Contact a health care provider if:  You think you are having a reaction to a medicine you are taking.  You have  headaches that keep coming back (recurring).  You feel dizzy.  You have swelling in your ankles.  You have trouble with your vision. Get help right away if:  You develop a severe headache or confusion.  You have unusual weakness or numbness.  You feel faint.  You have severe pain in your chest or abdomen.  You vomit repeatedly.  You have trouble breathing. Summary  Hypertension is when the force of blood pumping through your arteries is too strong. If this condition is not controlled, it may put you at risk for serious complications.  Your personal target blood pressure may vary depending on your medical conditions, your age, and other factors. For most people, a normal blood pressure is less than 120/80.  Hypertension is treated with lifestyle changes, medicines, or a combination of both. Lifestyle changes include weight loss, eating a healthy, low-sodium diet, exercising more, and limiting alcohol. This information is not intended to replace advice given to you by your health care provider. Make sure you discuss any questions you have with your health care provider. Document Released: 05/27/2005 Document Revised: 04/24/2016 Document Reviewed: 04/24/2016 Elsevier Interactive Patient Education  [employer].

## 2018-04-29 NOTE — Progress Notes (Signed)
Pre visit review using our clinic review tool, if applicable. No additional management support is needed unless otherwise documented below in the visit note. 

## 2018-04-29 NOTE — Progress Notes (Signed)
Chief Complaint  Patient presents with  . Follow-up   F/u  1. C/o dizziness at times  2. BP elevated 144/102 yesterday at endocrine appt 04/28/18 and today elevated no h/o HTN she has gained wt no h/a  3. Hyperparathyroidism saw endocrine yesterday ca 10.9 and PTH 78 being referral to Dr. Carmin Muskrat for surgery tbd  4. Still c/o allergies and inner ear dysfunction though taking flonase prn and otc antihistamine    Review of Systems  Constitutional: Negative for weight loss.  HENT: Negative for hearing loss.   Eyes: Negative for blurred vision.  Respiratory: Negative for shortness of breath.   Cardiovascular: Negative for chest pain.  Gastrointestinal: Negative for abdominal pain.  Musculoskeletal: Negative for falls.  Skin: Negative for rash.  Neurological: Positive for dizziness.  Psychiatric/Behavioral: Negative for depression.   Past Medical History:  Diagnosis Date  . Allergy   . Depression   . History of blood transfusion    after hysterectomy complication in 0037 where bladder laceration and severe infection   . History of chickenpox   . Kidney stone   . Kidney stones   . Migraines   . UTI (urinary tract infection)    recurrent x 5 not had since 2014    Past Surgical History:  Procedure Laterality Date  . ABDOMINAL HYSTERECTOMY     reason DUB; no h/o abnormal Pap. Dr. Vilinda [facility]anks [facility] still has ovaries and FT intact not cervix   . CESAREAN SECTION     1994  . CHOLECYSTECTOMY     1993  . CYSTOSCOPY WITH STENT PLACEMENT Left 09/12/2016   Procedure: CYSTOSCOPY WITH STENT PLACEMENT;  Surgeon: Nickie Retort, MD;  Location: [facility];  Service: Urology;  Laterality: Left;  . KIDNEY STONE SURGERY    . MENISCUS REPAIR     b/l knees   . thermal hydroablation     DUB  . TONSILLECTOMY    . URETEROSCOPY WITH HOLMIUM LASER LITHOTRIPSY Left 09/12/2016   Procedure: URETEROSCOPY WITH HOLMIUM LASER LITHOTRIPSY;  Surgeon: Nickie Retort, MD;  Location: [facility];  Service:  Urology;  Laterality: Left;   Family History  Problem Relation Age of Onset  . Arthritis Mother   . Depression Mother   . Diabetes Mother   . Hearing loss Mother   . Stroke Mother   . Early death Father   . Heart disease Father        died age 38 mi   . Hyperlipidemia Father   . Hypertension Father   . COPD Sister   . Depression Sister   . Diabetes Sister   . Early death Sister   . Hyperlipidemia Sister   . Hypertension Sister   . Cancer Maternal Grandfather        lung smoker   . Heart disease Paternal Grandfather   . Depression Sister   . Diabetes Sister   . Diabetes Sister   . Prostate cancer Neg Hx   . Kidney cancer Neg Hx   . Bladder Cancer Neg Hx    Social History   Socioeconomic History  . Marital status: Married    Spouse name: Not on file  . Number of children: Not on file  . Years of education: Not on file  . Highest education level: Not on file  Occupational History  . Not on file  Social Needs  . Financial resource strain: Not on file  . Food insecurity:    Worry: Not on file  Inability: Not on file  . Transportation needs:    Medical: Not on file    Non-medical: Not on file  Tobacco Use  . Smoking status: Never Smoker  . Smokeless tobacco: Never Used  Substance and Sexual Activity  . Alcohol use: Yes  . Drug use: No  . Sexual activity: Not on file  Lifestyle  . Physical activity:    Days per week: Not on file    Minutes per session: Not on file  . Stress: Not on file  Relationships  . Social connections:    Talks on phone: Not on file    Gets together: Not on file    Attends religious service: Not on file    Active member of club or organization: Not on file    Attends meetings of clubs or organizations: Not on file    Relationship status: Not on file  . Intimate partner violence:    Fear of current or ex partner: Not on file    Emotionally abused: Not on file    Physically abused: Not on file    Forced sexual activity: Not on  file  Other Topics Concern  . Not on file  Social History Narrative   Married    2 girls 1 boy    Research scientist (life sciences)    Never smoker    No guns    Safe in relationship    Current Meds  Medication Sig  . Cholecalciferol (EQL VITAMIN D3 GUMMIES PO) Take 2 each by mouth daily.   . DULoxetine (CYMBALTA) 30 MG capsule Take 1 capsule (30 mg total) by mouth daily. +60=90 mg qd  . DULoxetine (CYMBALTA) 60 MG capsule Take 1 capsule (60 mg total) by mouth daily. +30=90 mg daily  . fluticasone (FLONASE) 50 MCG/ACT nasal spray Place 2 sprays into both nostrils daily. As needed  . MELATONIN PO Take by mouth.  . [DISCONTINUED] azithromycin (ZITHROMAX) 250 MG tablet 2 pills day 1 1 pill day 2-5   Allergies  Allergen Reactions  . Penicillins Shortness Of Breath    rash   No results found for this or any previous visit (from the past 2160 hour(s)). Objective  Body mass index is 32.68 kg/m. Wt Readings from Last 3 Encounters:  04/29/18 190 lb 6.4 oz (86.4 kg)  02/05/18 182 lb 9.6 oz (82.8 kg)  12/23/17 176 lb 9.6 oz (80.1 kg)   Temp Readings from Last 3 Encounters:  04/29/18 98.2 F (36.8 C) (Oral)  02/05/18 97.8 F (36.6 C) (Oral)  12/23/17 98 F (36.7 C) (Oral)   BP Readings from Last 3 Encounters:  04/29/18 (!) 146/96  02/05/18 118/70  12/23/17 136/84   Pulse Readings from Last 3 Encounters:  04/29/18 89  02/05/18 94  12/23/17 98    Physical Exam  Constitutional: She is oriented to person, place, and time. Vital signs are normal. She appears well-developed and well-nourished. She is cooperative.  HENT:  Head: Normocephalic and atraumatic.  Mouth/Throat: Oropharynx is clear and moist and mucous membranes are normal.  B/l fluid in ears   Eyes: Pupils are equal, round, and reactive to light. Conjunctivae are normal.  Cardiovascular: Normal rate, regular rhythm and normal heart sounds.  Pulmonary/Chest: Effort normal and breath sounds normal.  Neurological: She is alert and  oriented to person, place, and time. Gait normal.  Skin: Skin is warm, dry and intact.  Psychiatric: She has a normal mood and affect. Her speech is normal and behavior is normal. Judgment and  thought content normal. Cognition and memory are normal.  Nursing note and vitals reviewed.   Assessment   1. htn  2. Dizziness could be related AR vs #1  3. Allergic rhinitis  4. Hyperparathyroidism  5. HM Plan   1. Add norvasc 2.5 mg qd  2. And 3. Add singulair and otc flonase and antihistamine  4. Pending surgery Dr. Maudie Mercury 5.  Has not had flu shot in a while Tdap utd  rec MMR vaccine and hep B new vaccine f/u in 1 month  Declines HIV testing   S/p hysterectomy DUB no h/o abnormal pap ovaries and FT intact no cervix will not do further  Mammogram 01/20/18 negative   Colonoscopy [facility] in [place] requested records today 2nd time Never smoker  No need for dermatology now  Provider: Dr. Olivia Mackie McLean-Scocuzza-Internal Medicine

## 2018-05-05 ENCOUNTER — Encounter: Payer: Self-pay | Admitting: Internal Medicine

## 2018-05-13 IMAGING — CT CT RENAL STONE PROTOCOL
2 of 4 series · 16 of 46 positions shown, 18 images · non-contrast
Comparison: 10/11/2013

CLINICAL DATA: Left abdominal pain

EXAM:
CT ABDOMEN AND PELVIS WITHOUT CONTRAST
TECHNIQUE: Multidetector CT imaging of the abdomen and pelvis was performed
following the standard protocol without IV contrast.

[Series 2: stone full standard · axial · 0.72mm/px · z∈[-245,+190]mm · 13 of 95 slices shown, 15 images]
[im 4/95  soft-tissue]
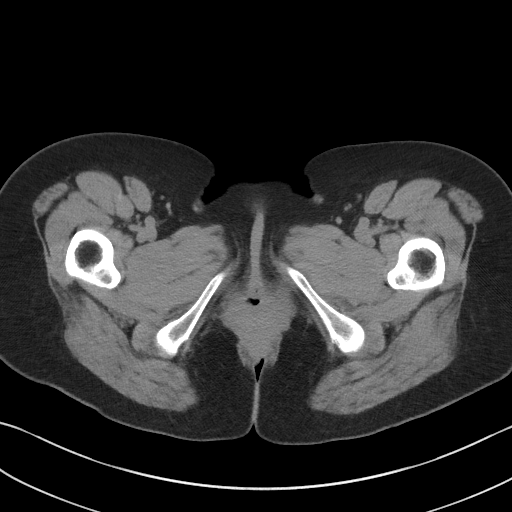
[im 4/95  bone]
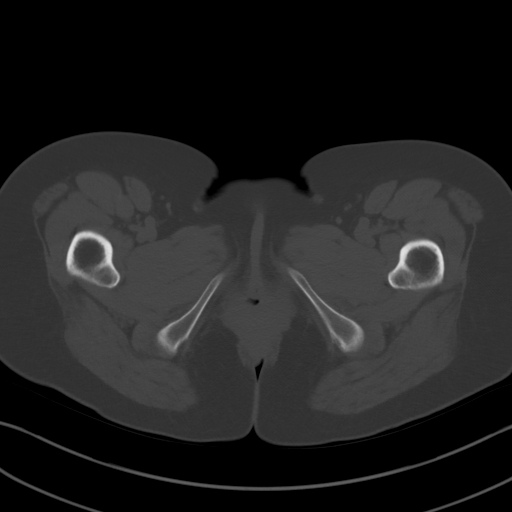
[im 12/95  soft-tissue]
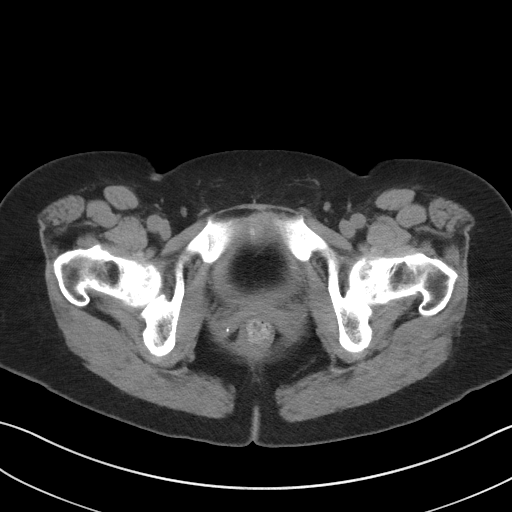
[im 20/95  soft-tissue]
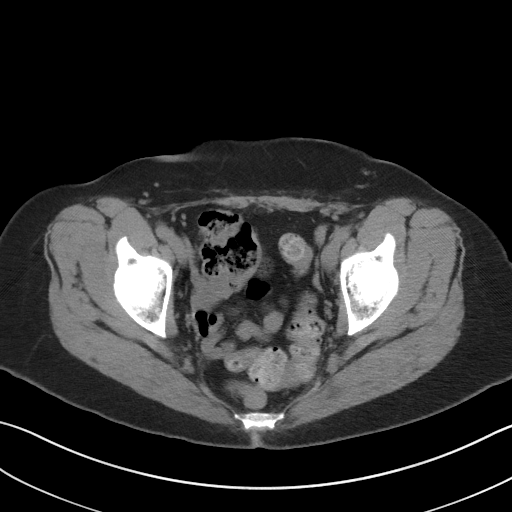
[im 28/95  soft-tissue]
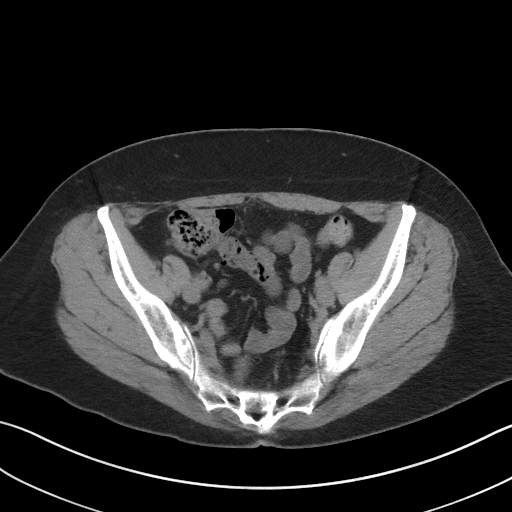
[im 32/95  soft-tissue]
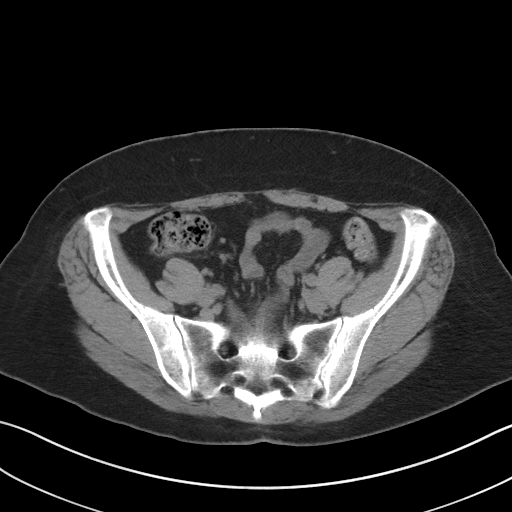
[im 40/95  soft-tissue]
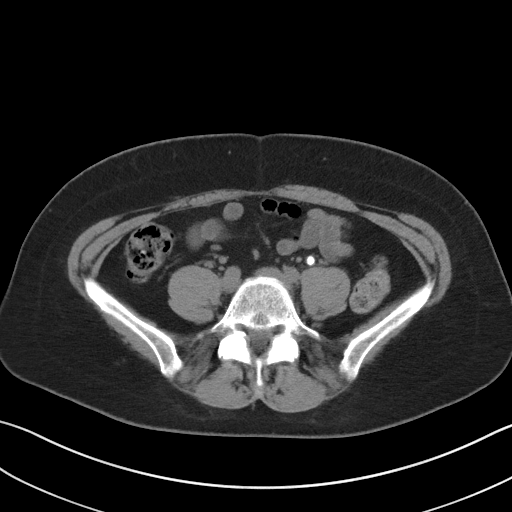
[im 48/95  soft-tissue]
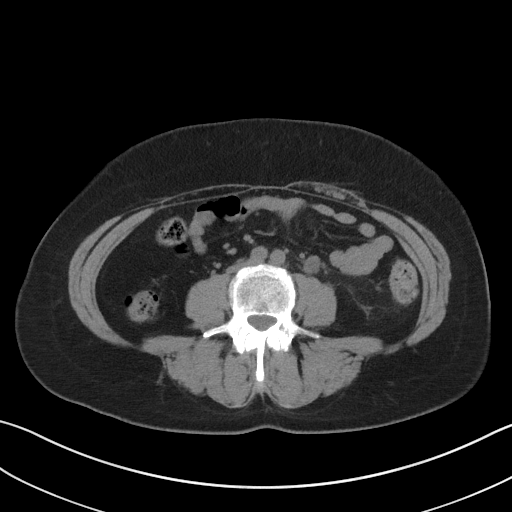
[im 55/95  soft-tissue]
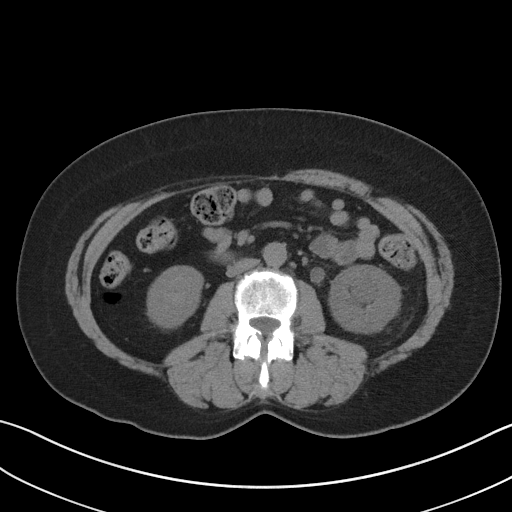
[im 63/95  soft-tissue]
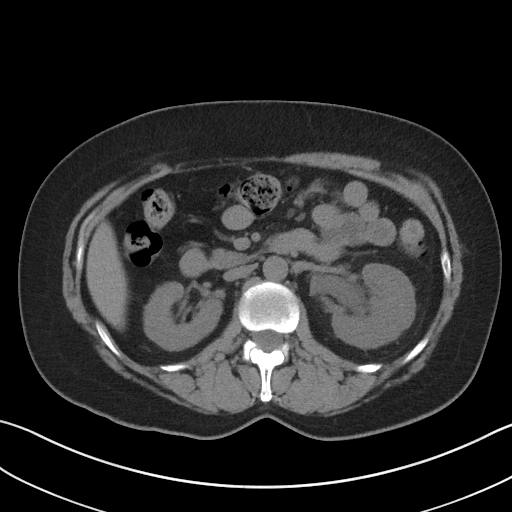
[im 63/95  bone]
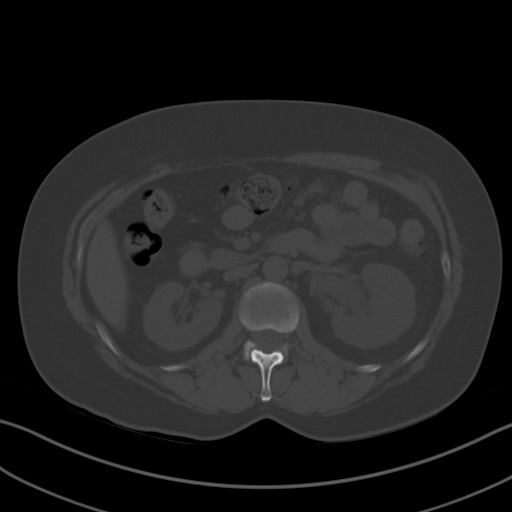
[im 67/95  soft-tissue]
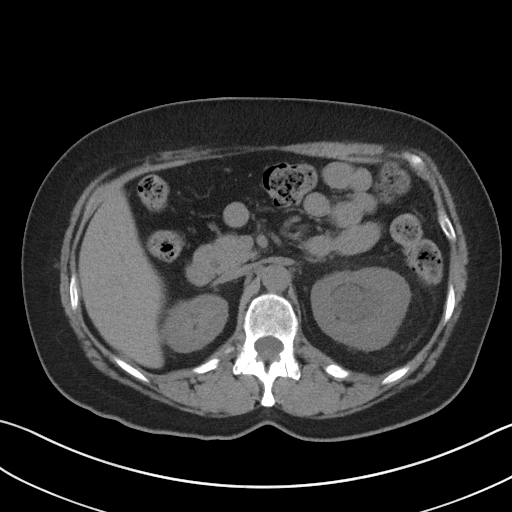
[im 75/95  soft-tissue]
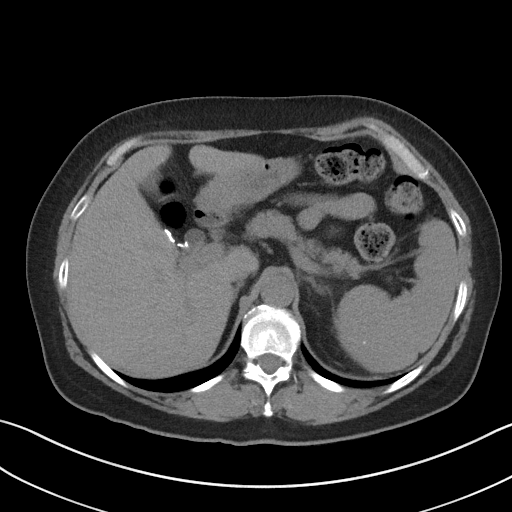
[im 83/95  soft-tissue]
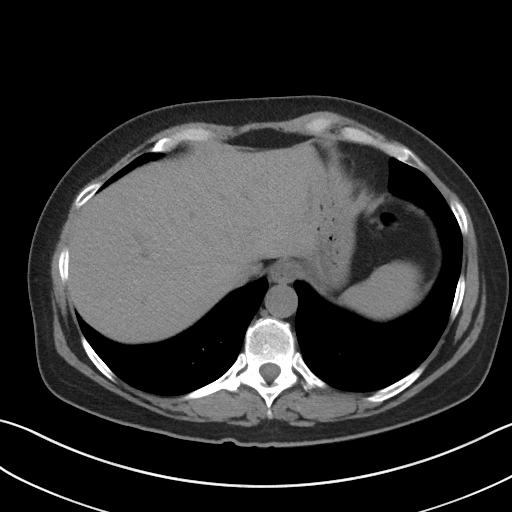
[im 91/95  soft-tissue]
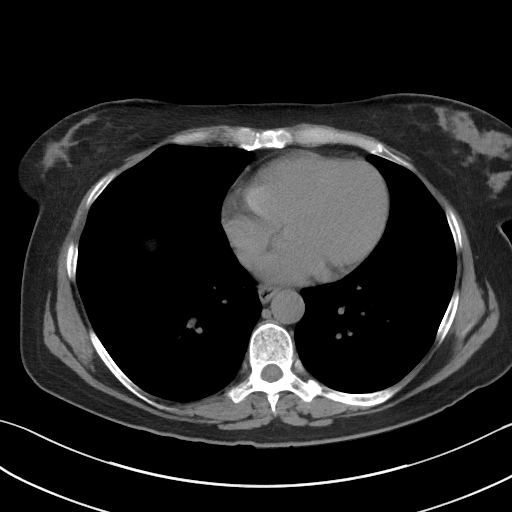

[Series 5: coronal · coronal · 0.74mm/px · 3 of 122 slices shown]
[im 41/122  soft-tissue]
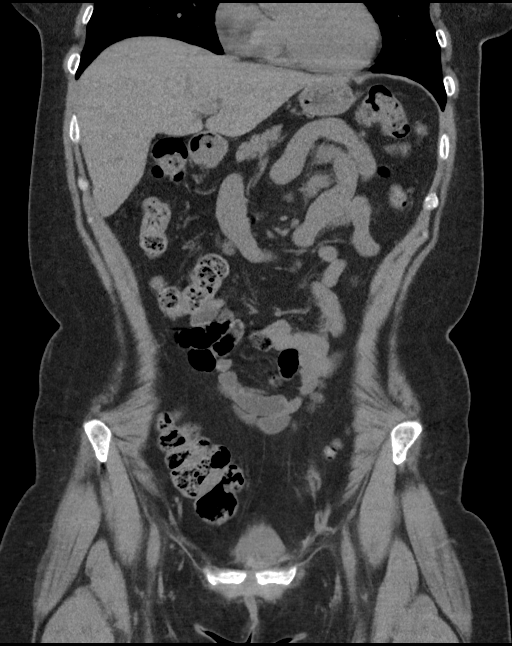
[im 54/122  soft-tissue]
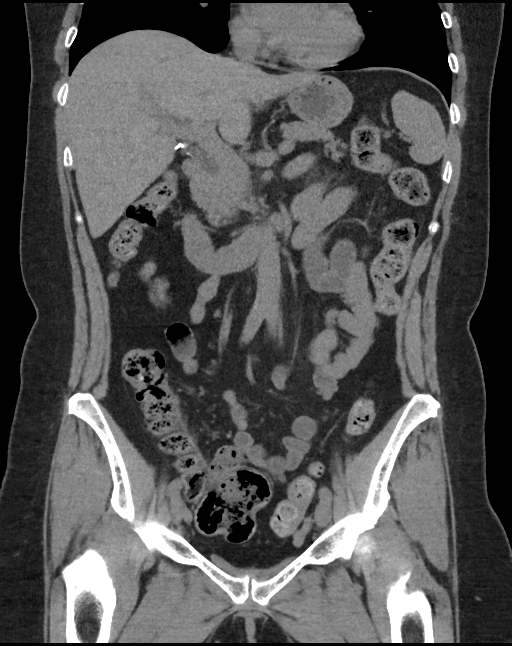
[im 68/122  soft-tissue]
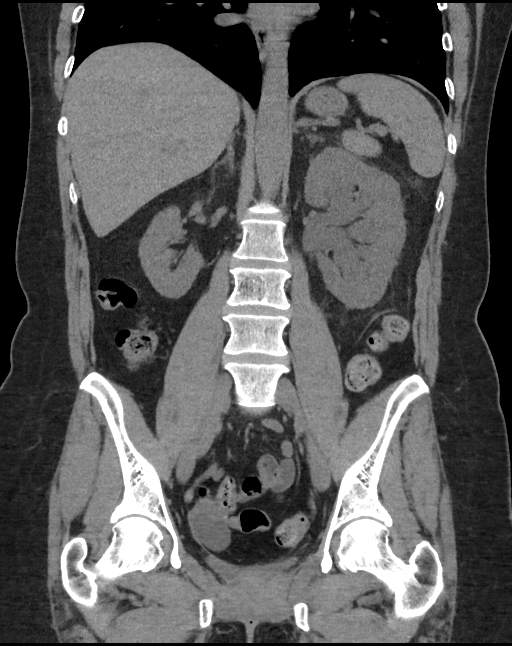

[16 of 46 positions shown; findings below may reference images not displayed]

FINDINGS: Lower chest: No acute abnormality.

Hepatobiliary: Unremarkable liver.  Postcholecystectomy.

Pancreas: Unremarkable

Spleen: Calcified granulomata.

Adrenals/Urinary Tract: Moderate to severe left hydronephrosis. 7 mm
mid left ureteral calculus. Tiny calculus in the upper pole of the
right kidney.

Stomach/Bowel: Stomach is unremarkable. Normal appendix. No evidence
of small-bowel obstruction.

Vascular/Lymphatic: No abnormal adenopathy. No evidence of aortic
aneurysm. Mild atherosclerotic calcifications of the aorta and iliac
arteries.

Reproductive: Uterus is absent. Left adnexa is unremarkable. Cystic
abnormality in the right adnexa previously measured 5.6 cm, and
state measures 3.7 cm. Regression supports benign etiology.

Other: No free-fluid.  Pelvic phleboliths are noted.

Musculoskeletal: No vertebral compression deformity. Broad-based
disc herniation at L4-5 possibly causes spinal stenosis.
IMPRESSION: 7 mm mid left ureteral calculus is associated with left
hydronephrosis.

Right nephrolithiasis.

Degenerative disc disease at L4-5.

Chronic changes.

## 2018-05-26 ENCOUNTER — Encounter: Payer: Self-pay | Admitting: Internal Medicine

## 2018-05-26 ENCOUNTER — Ambulatory Visit: Payer: 59 | Admitting: Internal Medicine

## 2018-05-26 VITALS — BP 138/96 | HR 84 | Temp 98.1°F | Resp 14 | Ht 64.0 in | Wt 195.2 lb

## 2018-05-26 DIAGNOSIS — E210 Primary hyperparathyroidism: Secondary | ICD-10-CM | POA: Diagnosis not present

## 2018-05-26 DIAGNOSIS — M79674 Pain in right toe(s): Secondary | ICD-10-CM | POA: Diagnosis not present

## 2018-05-26 MED ORDER — TRAMADOL HCL 50 MG PO TABS
50.0000 mg | ORAL_TABLET | Freq: Two times a day (BID) | ORAL | 0 refills | Status: DC | PRN
Start: 2018-05-26 — End: 2018-07-08

## 2018-05-26 MED ORDER — PREDNISONE 20 MG PO TABS: 40.0000 mg | ORAL_TABLET | Freq: Every day | ORAL | 0 refills | Status: DC

## 2018-05-26 MED ORDER — DOXYCYCLINE HYCLATE 100 MG PO TABS: 100.0000 mg | ORAL_TABLET | Freq: Two times a day (BID) | ORAL | 0 refills | Status: DC

## 2018-05-26 NOTE — Progress Notes (Addendum)
Chief Complaint  Patient presents with  . Acute Visit    Right 4th toe infected started hurting on 'Sunday   Acute visit  1. C/o right 4th toe pain since Sunday tried salt water soaks and Aleve no trauma toe is painful and swollen and burning and tingling 8/10, no h/o trauma. Pain is keeping her up at night 2. hyperparthyroidism she has parathyroid surgery planned 05/29/18 with Dr. Kim surgery [facility]   Review of Systems  Constitutional: Negative for fever.  Respiratory: Negative for shortness of breath.   Cardiovascular: Negative for chest pain.  Musculoskeletal: Positive for joint pain.  Neurological: Negative for headaches.   Past Medical History:  Diagnosis Date  . Allergy   . Depression   . History of blood transfusion    after hysterectomy complication in 1998 where bladder laceration and severe infection   . History of chickenpox   . Hyperparathyroidism ([facility])   . Kidney stone   . Kidney stones   . Migraines   . UTI (urinary tract infection)    recurrent x 5 not had since 2014    Past Surgical History:  Procedure Laterality Date  . ABDOMINAL HYSTERECTOMY     reason DUB; no h/o abnormal Pap. Dr. [facility] still has ovaries and FT intact not cervix   . CESAREAN SECTION     1994  . CHOLECYSTECTOMY     19' 93  . CYSTOSCOPY WITH STENT PLACEMENT Left 09/12/2016   Procedure: CYSTOSCOPY WITH STENT PLACEMENT;  Surgeon: Nickie Retort, MD;  Location: [facility];  Service: Urology;  Laterality: Left;  . KIDNEY STONE SURGERY    . MENISCUS REPAIR     b/l knees   . thermal hydroablation     DUB  . TONSILLECTOMY    . URETEROSCOPY WITH HOLMIUM LASER LITHOTRIPSY Left 09/12/2016   Procedure: URETEROSCOPY WITH HOLMIUM LASER LITHOTRIPSY;  Surgeon: Nickie Retort, MD;  Location: [facility];  Service: Urology;  Laterality: Left;   Family History  Problem Relation Age of Onset  . Arthritis Mother   . Depression Mother   . Diabetes Mother   . Hearing loss Mother   . Stroke Mother     . Early death Father   . Heart disease Father        died age 28 mi   . Hyperlipidemia Father   . Hypertension Father   . COPD Sister   . Depression Sister   . Diabetes Sister   . Early death Sister   . Hyperlipidemia Sister   . Hypertension Sister   . Cancer Maternal Grandfather        lung smoker   . Heart disease Paternal Grandfather   . Depression Sister   . Diabetes Sister   . Diabetes Sister   . Prostate cancer Neg Hx   . Kidney cancer Neg Hx   . Bladder Cancer Neg Hx    Social History   Socioeconomic History  . Marital status: Married    Spouse name: Not on file  . Number of children: Not on file  . Years of education: Not on file  . Highest education level: Not on file  Occupational History  . Not on file  Social Needs  . Financial resource strain: Not on file  . Food insecurity:    Worry: Not on file    Inability: Not on file  . Transportation needs:    Medical: Not on file    Non-medical: Not on file  Tobacco  Use  . Smoking status: Never Smoker  . Smokeless tobacco: Never Used  Substance and Sexual Activity  . Alcohol use: Yes  . Drug use: No  . Sexual activity: Not on file  Lifestyle  . Physical activity:    Days per week: Not on file    Minutes per session: Not on file  . Stress: Not on file  Relationships  . Social connections:    Talks on phone: Not on file    Gets together: Not on file    Attends religious service: Not on file    Active member of club or organization: Not on file    Attends meetings of clubs or organizations: Not on file    Relationship status: Not on file  . Intimate partner violence:    Fear of current or ex partner: Not on file    Emotionally abused: Not on file    Physically abused: Not on file    Forced sexual activity: Not on file  Other Topics Concern  . Not on file  Social History Narrative   Married    2 girls 1 boy    Research scientist (life sciences)    Never smoker    No guns    Safe in relationship    Current Meds   Medication Sig  . amLODipine (NORVASC) 2.5 MG tablet Take 1 tablet (2.5 mg total) by mouth daily. In am  . Cholecalciferol (EQL VITAMIN D3 GUMMIES PO) Take 2 each by mouth daily.   . DULoxetine (CYMBALTA) 30 MG capsule Take 1 capsule (30 mg total) by mouth daily. +60=90 mg qd  . DULoxetine (CYMBALTA) 60 MG capsule Take 1 capsule (60 mg total) by mouth daily. +30=90 mg daily  . fluticasone (FLONASE) 50 MCG/ACT nasal spray Place 2 sprays into both nostrils daily. As needed  . MELATONIN PO Take by mouth.  . montelukast (SINGULAIR) 10 MG tablet Take 1 tablet (10 mg total) by mouth at bedtime.   Allergies  Allergen Reactions  . Penicillins Shortness Of Breath    rash   No results found for this or any previous visit (from the past 2160 hour(s)). Objective  Body mass index is 33.51 kg/m. Wt Readings from Last 3 Encounters:  05/26/18 195 lb 3.2 oz (88.5 kg)  04/29/18 190 lb 6.4 oz (86.4 kg)  02/05/18 182 lb 9.6 oz (82.8 kg)   Temp Readings from Last 3 Encounters:  05/26/18 98.1 F (36.7 C) (Oral)  04/29/18 98.2 F (36.8 C) (Oral)  02/05/18 97.8 F (36.6 C) (Oral)   BP Readings from Last 3 Encounters:  05/26/18 (!) 138/96  04/29/18 (!) 146/96  02/05/18 118/70   Pulse Readings from Last 3 Encounters:  05/26/18 84  04/29/18 89  02/05/18 94    Physical Exam Vitals signs and nursing note reviewed.  Constitutional:      Appearance: Normal appearance.  HENT:     Head: Normocephalic and atraumatic.     Mouth/Throat:     Mouth: Mucous membranes are moist.     Pharynx: Oropharynx is clear.  Eyes:     Conjunctiva/sclera: Conjunctivae normal.     Pupils: Pupils are equal, round, and reactive to light.  Cardiovascular:     Rate and Rhythm: Normal rate and regular rhythm.     Heart sounds: Normal heart sounds.  Pulmonary:     Effort: Pulmonary effort is normal.     Breath sounds: Normal breath sounds.  Musculoskeletal:       Feet:  Skin:  General: Skin is warm and dry.   Neurological:     General: No focal deficit present.     Mental Status: She is alert and oriented to person, place, and time.     Gait: Gait normal.  Psychiatric:        Attention and Perception: Attention and perception normal.        Mood and Affect: Mood and affect normal.        Speech: Speech normal.        Behavior: Behavior normal. Behavior is cooperative.        Thought Content: Thought content normal.        Cognition and Memory: Cognition and memory normal.        Judgment: Judgment normal.     Assessment   1. Right 4th toe pain ddx infectious vs inflammatory. No h/o trauma 2. Hyperparathyroidism  3.HM Plan   1. Tramadol prn  Tylenol or Ibuprofen prn  Prednisone 40 mg x 7 days to start 1 day after surgery after speaking to Dr. Maudie Mercury  Doxy Warm salt soaks, ice and elevation  If still bothersome will Xray  Spoke with Dr. Maudie Mercury today he is ok with plan as above prior to surgery   2. Surgery with Dr. 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  Has not had flu shot in a while Tdap utd  rec MMR vaccine and hep B new vaccine f/u in 1 month at f/u  Declines HIV testing   S/p hysterectomy DUB no h/o abnormal pap ovaries and FT intact no cervix will not do further  Mammogram 01/20/18 negative   Colonoscopy [facility] in [place] requested records today 2nd time Never smoker  No need for dermatology now  Provider: Dr. Olivia Mackie McLean-Scocuzza-Internal Medicine

## 2018-05-26 NOTE — Patient Instructions (Addendum)
Warm soaks with salt  Elevation  Tylenol as needed with Ibuprofen or Aleve as needed  Tramadol as needed for pain  Ice x 20 minute intervals   Call me back if not better next week think about Xray toe

## 2018-06-04 ENCOUNTER — Ambulatory Visit: Payer: 59

## 2018-07-08 ENCOUNTER — Ambulatory Visit: Payer: 59 | Admitting: Internal Medicine

## 2018-07-08 ENCOUNTER — Encounter: Payer: Self-pay | Admitting: Internal Medicine

## 2018-07-08 VITALS — BP 132/88 | HR 87 | Temp 98.8°F | Ht 64.0 in | Wt 198.2 lb

## 2018-07-08 DIAGNOSIS — E213 Hyperparathyroidism, unspecified: Secondary | ICD-10-CM

## 2018-07-08 DIAGNOSIS — E785 Hyperlipidemia, unspecified: Secondary | ICD-10-CM

## 2018-07-08 DIAGNOSIS — H6993 Unspecified Eustachian tube disorder, bilateral: Secondary | ICD-10-CM

## 2018-07-08 DIAGNOSIS — I10 Essential (primary) hypertension: Secondary | ICD-10-CM | POA: Diagnosis not present

## 2018-07-08 DIAGNOSIS — J309 Allergic rhinitis, unspecified: Secondary | ICD-10-CM | POA: Diagnosis not present

## 2018-07-08 DIAGNOSIS — Z23 Encounter for immunization: Secondary | ICD-10-CM | POA: Diagnosis not present

## 2018-07-08 DIAGNOSIS — Z1329 Encounter for screening for other suspected endocrine disorder: Secondary | ICD-10-CM

## 2018-07-08 DIAGNOSIS — Z1159 Encounter for screening for other viral diseases: Secondary | ICD-10-CM

## 2018-07-08 DIAGNOSIS — Z1389 Encounter for screening for other disorder: Secondary | ICD-10-CM

## 2018-07-08 DIAGNOSIS — E669 Obesity, unspecified: Secondary | ICD-10-CM

## 2018-07-08 DIAGNOSIS — E66811 Obesity, class 1: Secondary | ICD-10-CM

## 2018-07-08 MED ORDER — FLUTICASONE PROPIONATE 50 MCG/ACT NA SUSP: 2.0000 | Freq: Every day | NASAL | 11 refills | Status: DC

## 2018-07-08 MED ORDER — AMLODIPINE BESYLATE 5 MG PO TABS: 5.0000 mg | ORAL_TABLET | Freq: Every day | ORAL | 3 refills | Status: DC

## 2018-07-08 MED ORDER — MONTELUKAST SODIUM 10 MG PO TABS: 10.0000 mg | ORAL_TABLET | Freq: Every day | ORAL | 3 refills | Status: DC

## 2018-07-08 NOTE — Progress Notes (Signed)
Chief Complaint  Patient presents with  . Follow-up   F/u  1. HTN improved norvasc 2.5 mg qd BP improved  2. S/p parathyroid surgery doing well 05/29/18 surgery and ca 9.1 on 06/11/2018  3. Due 2nd hep B vx    Review of Systems  Constitutional: Negative for weight loss.  HENT: Negative for hearing loss.   Eyes: Negative for blurred vision.  Respiratory: Negative for shortness of breath.   Cardiovascular: Negative for chest pain.  Gastrointestinal: Negative for abdominal pain.  Musculoskeletal:       Toe pain resolved   Skin: Negative for rash.  Neurological: Negative for headaches.  Psychiatric/Behavioral: Negative for depression.   Past Medical History:  Diagnosis Date  . Allergy   . Depression   . History of blood transfusion    after hysterectomy complication in 6712 where bladder laceration and severe infection   . History of chickenpox   . Hyperparathyroidism ([place])   . Kidney stone   . Kidney stones   . Migraines   . UTI (urinary tract infection)    recurrent x 5 not had since 2014    Past Surgical History:  Procedure Laterality Date  . ABDOMINAL HYSTERECTOMY     reason DUB; no h/o abnormal Pap. Dr. Vilinda [facility] still has ovaries and FT intact not cervix   . CESAREAN SECTION     1994  . CHOLECYSTECTOMY     1993  . CYSTOSCOPY WITH STENT PLACEMENT Left 09/12/2016   Procedure: CYSTOSCOPY WITH STENT PLACEMENT;  Surgeon: Nickie Retort, MD;  Location: [facility];  Service: Urology;  Laterality: Left;  . KIDNEY STONE SURGERY    . MENISCUS REPAIR     b/l knees   . thermal hydroablation     DUB  . TONSILLECTOMY    . URETEROSCOPY WITH HOLMIUM LASER LITHOTRIPSY Left 09/12/2016   Procedure: URETEROSCOPY WITH HOLMIUM LASER LITHOTRIPSY;  Surgeon: Nickie Retort, MD;  Location: [facility];  Service: Urology;  Laterality: Left;   Family History  Problem Relation Age of Onset  . Arthritis Mother   . Depression Mother   . Diabetes Mother   . Hearing loss Mother   .  Stroke Mother   . Early death Father   . Heart disease Father        died age 22 mi   . Hyperlipidemia Father   . Hypertension Father   . COPD Sister   . Depression Sister   . Diabetes Sister   . Early death Sister   . Hyperlipidemia Sister   . Hypertension Sister   . Cancer Maternal Grandfather        lung smoker   . Heart disease Paternal Grandfather   . Depression Sister   . Diabetes Sister   . Diabetes Sister   . Prostate cancer Neg Hx   . Kidney cancer Neg Hx   . Bladder Cancer Neg Hx    Social History   Socioeconomic History  . Marital status: Married    Spouse name: Not on file  . Number of children: Not on file  . Years of education: Not on file  . Highest education level: Not on file  Occupational History  . Not on file  Social Needs  . Financial resource strain: Not on file  . Food insecurity:    Worry: Not on file    Inability: Not on file  . Transportation needs:    Medical: Not on file    Non-medical: Not  on file  Tobacco Use  . Smoking status: Never Smoker  . Smokeless tobacco: Never Used  Substance and Sexual Activity  . Alcohol use: Yes  . Drug use: No  . Sexual activity: Not on file  Lifestyle  . Physical activity:    Days per week: Not on file    Minutes per session: Not on file  . Stress: Not on file  Relationships  . Social connections:    Talks on phone: Not on file    Gets together: Not on file    Attends religious service: Not on file    Active member of club or organization: Not on file    Attends meetings of clubs or organizations: Not on file    Relationship status: Not on file  . Intimate partner violence:    Fear of current or ex partner: Not on file    Emotionally abused: Not on file    Physically abused: Not on file    Forced sexual activity: Not on file  Other Topics Concern  . Not on file  Social History Narrative   Married    2 girls 1 boy    Research scientist (life sciences)    Never smoker    No guns    Safe in relationship      Current Meds  Medication Sig  . amLODipine (NORVASC) 2.5 MG tablet Take 1 tablet (2.5 mg total) by mouth daily. In am  . Cholecalciferol (EQL VITAMIN D3 GUMMIES PO) Take 2 each by mouth daily.   . DULoxetine (CYMBALTA) 30 MG capsule Take 1 capsule (30 mg total) by mouth daily. +60=90 mg qd  . DULoxetine (CYMBALTA) 60 MG capsule Take 1 capsule (60 mg total) by mouth daily. +30=90 mg daily  . fluticasone (FLONASE) 50 MCG/ACT nasal spray Place 2 sprays into both nostrils daily. As needed  . MELATONIN PO Take by mouth.  . montelukast (SINGULAIR) 10 MG tablet Take 1 tablet (10 mg total) by mouth at bedtime.  . traMADol (ULTRAM) 50 MG tablet Take 1 tablet (50 mg total) by mouth every 12 (twelve) hours as needed.   Allergies  Allergen Reactions  . Penicillins Shortness Of Breath    rash   No results found for this or any previous visit (from the past 2160 hour(s)). Objective  Body mass index is 34.02 kg/m. Wt Readings from Last 3 Encounters:  07/08/18 198 lb 3.2 oz (89.9 kg)  05/26/18 195 lb 3.2 oz (88.5 kg)  04/29/18 190 lb 6.4 oz (86.4 kg)   Temp Readings from Last 3 Encounters:  07/08/18 98.8 F (37.1 C) (Oral)  05/26/18 98.1 F (36.7 C) (Oral)  04/29/18 98.2 F (36.8 C) (Oral)   BP Readings from Last 3 Encounters:  07/08/18 132/88  05/26/18 (!) 138/96  04/29/18 (!) 146/96   Pulse Readings from Last 3 Encounters:  07/08/18 87  05/26/18 84  04/29/18 89    Physical Exam Vitals signs and nursing note reviewed.  Constitutional:      Appearance: Normal appearance. She is well-developed. She is obese.  HENT:     Head: Normocephalic and atraumatic.     Nose: Nose normal.     Mouth/Throat:     Mouth: Mucous membranes are moist.     Pharynx: Oropharynx is clear.  Eyes:     Conjunctiva/sclera: Conjunctivae normal.     Pupils: Pupils are equal, round, and reactive to light.  Cardiovascular:     Rate and Rhythm: Normal rate and regular rhythm.  Heart sounds: Normal  heart sounds.  Pulmonary:     Effort: Pulmonary effort is normal.     Breath sounds: Normal breath sounds.  Skin:    General: Skin is warm and dry.       Neurological:     General: No focal deficit present.     Mental Status: She is alert and oriented to person, place, and time.     Gait: Gait normal.  Psychiatric:        Attention and Perception: Attention and perception normal.        Mood and Affect: Mood and affect normal.        Speech: Speech normal.        Behavior: Behavior normal. Behavior is cooperative.        Thought Content: Thought content normal.        Cognition and Memory: Cognition and memory normal.        Judgment: Judgment normal.     Assessment   1. HTN improved  2. S/p parathyroid removal for adenoma  3. HM Plan   1. Increase norvasc to 5 mg qd  Check fasting labs at f/u  2. Doing well  3.  Declines flu shot  Tdaputd  rec MMR vaccine given Rx today and hep B new vaccine 2nd dose today Declines HIV testing   S/p hysterectomy DUB no h/o abnormal pap ovaries and FT intact no cervix will not do further Mammogram 01/20/18 negative Colonoscopy see report 07/29/16 f/u in 10 years Never smoker  No need for dermatology now  Provider: Dr. Olivia Mackie McLean-Scocuzza-Internal Medicine

## 2018-07-08 NOTE — Patient Instructions (Addendum)
Results for NARJIS, MIRA (MRN [id number]) as of 07/08/2018 08:44  Ref. Range 12/23/2017 16:08  Rubella Latest Units: index <0.90 (L)  Hepatitis B-Post Latest Ref Range: > OR = 10 mIU/mL <5 (L)  Mumps IgG Latest Units: AU/mL 14.70  Rubeola IgG Latest Units: AU/mL <25.00 (L)   Consider MMR vaccine in future    Cholesterol Cholesterol is a white, waxy, fat-like substance that is needed by the human body in small amounts. The liver makes all the cholesterol we need. Cholesterol is carried from the liver by the blood through the blood vessels. Deposits of cholesterol (plaques) may build up on blood vessel (artery) walls. Plaques make the arteries narrower and stiffer. Cholesterol plaques increase the risk for heart attack and stroke. You cannot feel your cholesterol level even if it is very high. The only way to know that it is high is to have a blood test. Once you know your cholesterol levels, you should keep a record of the test results. Work with your health care provider to keep your levels in the desired range. What do the results mean?  Total cholesterol is a rough measure of all the cholesterol in your blood.  LDL (low-density lipoprotein) is the "bad" cholesterol. This is the type that causes plaque to build up on the artery walls. You want this level to be low.  HDL (high-density lipoprotein) is the "good" cholesterol because it cleans the arteries and carries the LDL away. You want this level to be high.  Triglycerides are fat that the body can either burn for energy or store. High levels are closely linked to heart disease. What are the desired levels of cholesterol?  Total cholesterol below 200.  LDL below 100 for people who are at risk, below 70 for people at very high risk.  HDL above 40 is good. A level of 60 or higher is considered to be protective against heart disease.  Triglycerides below 150. How can I lower my cholesterol? Diet Follow your diet program as told by  your health care provider.  Choose fish or white meat chicken and [place], roasted or baked. Limit fatty cuts of red meat, fried foods, and processed meats, such as sausage and lunch meats.  Eat lots of fresh fruits and vegetables.  Choose whole grains, beans, pasta, potatoes, and cereals.  Choose olive oil, corn oil, or canola oil, and use only small amounts.  Avoid butter, mayonnaise, shortening, or palm kernel oils.  Avoid foods with trans fats.  Drink skim or nonfat milk and eat low-fat or nonfat yogurt and cheeses. Avoid whole milk, cream, ice cream, egg yolks, and full-fat cheeses.  Healthier desserts include angel food cake, ginger snaps, animal crackers, hard candy, popsicles, and low-fat or nonfat frozen yogurt. Avoid pastries, cakes, pies, and cookies.  Exercise  Follow your exercise program as told by your health care provider. A regular program: ? Helps to decrease LDL and raise HDL. ? Helps with weight control.  Do things that increase your activity level, such as gardening, walking, and taking the stairs.  Ask your health care provider about ways that you can be more active in your daily life. Medicine  Take over-the-counter and prescription medicines only as told by your health care provider. ? Medicine may be prescribed by your health care provider to help lower cholesterol and decrease the risk for heart disease. This is usually done if diet and exercise have failed to bring down cholesterol levels. ? If you have several risk  factors, you may need medicine even if your levels are normal. This information is not intended to replace advice given to you by your health care provider. Make sure you discuss any questions you have with your health care provider. Document Released: 02/19/2001 Document Revised: 12/23/2015 Document Reviewed: 11/25/2015 Elsevier Interactive Patient Education  [employer].

## 2018-07-08 NOTE — Progress Notes (Signed)
Pre visit review using our clinic review tool, if applicable. No additional management support is needed unless otherwise documented below in the visit note. 

## 2018-12-31 ENCOUNTER — Other Ambulatory Visit: Payer: Self-pay

## 2018-12-31 ENCOUNTER — Other Ambulatory Visit (INDEPENDENT_AMBULATORY_CARE_PROVIDER_SITE_OTHER): Payer: 59

## 2018-12-31 DIAGNOSIS — Z1159 Encounter for screening for other viral diseases: Secondary | ICD-10-CM | POA: Diagnosis not present

## 2018-12-31 DIAGNOSIS — I10 Essential (primary) hypertension: Secondary | ICD-10-CM

## 2018-12-31 DIAGNOSIS — E785 Hyperlipidemia, unspecified: Secondary | ICD-10-CM | POA: Diagnosis not present

## 2018-12-31 DIAGNOSIS — Z1329 Encounter for screening for other suspected endocrine disorder: Secondary | ICD-10-CM

## 2018-12-31 DIAGNOSIS — Z1389 Encounter for screening for other disorder: Secondary | ICD-10-CM

## 2018-12-31 LAB — LIPID PANEL
Cholesterol: 235 mg/dL — ABNORMAL HIGH (ref 0–200)
HDL: 68.3 mg/dL (ref >39.00)
LDL Cholesterol: 152 mg/dL — ABNORMAL HIGH (ref 0–99)
NonHDL: 166.58
Total CHOL/HDL Ratio: 3
Triglycerides: 75 mg/dL (ref 0.0–149.0)
VLDL: 15 mg/dL (ref 0.0–40.0)

## 2018-12-31 LAB — COMPREHENSIVE METABOLIC PANEL
ALT: 34 U/L (ref 0–35)
AST: 31 U/L (ref 0–37)
Albumin: 4.6 g/dL (ref 3.5–5.2)
Alkaline Phosphatase: 119 U/L — ABNORMAL HIGH (ref 39–117)
BUN: 26 mg/dL — ABNORMAL HIGH (ref 6–23)
CO2: 25 mEq/L (ref 19–32)
Calcium: 9.3 mg/dL (ref 8.4–10.5)
Chloride: 106 mEq/L (ref 96–112)
Creatinine, Ser: 0.84 mg/dL (ref 0.40–1.20)
GFR: 71.03 mL/min (ref >60.00)
Glucose, Bld: 103 mg/dL — ABNORMAL HIGH (ref 70–99)
Potassium: 4.1 mEq/L (ref 3.5–5.1)
Sodium: 139 mEq/L (ref 135–145)
Total Bilirubin: 0.5 mg/dL (ref 0.2–1.2)
Total Protein: 6.6 g/dL (ref 6.0–8.3)

## 2018-12-31 LAB — TSH: TSH: 1.44 u[IU]/mL (ref 0.35–4.50)

## 2018-12-31 LAB — CBC WITH DIFFERENTIAL/PLATELET
Basophils Absolute: 0 10*3/uL (ref 0.0–0.1)
Basophils Relative: 1 % (ref 0.0–3.0)
Eosinophils Absolute: 0.1 10*3/uL (ref 0.0–0.7)
Eosinophils Relative: 3 % (ref 0.0–5.0)
HCT: 38.2 % (ref 36.0–46.0)
Hemoglobin: 13.1 g/dL (ref 12.0–15.0)
Lymphocytes Relative: 33.3 % (ref 12.0–46.0)
Lymphs Abs: 1.6 10*3/uL (ref 0.7–4.0)
MCHC: 34.3 g/dL (ref 30.0–36.0)
MCV: 90.5 fl (ref 78.0–100.0)
Monocytes Absolute: 0.3 10*3/uL (ref 0.1–1.0)
Monocytes Relative: 6.5 % (ref 3.0–12.0)
Neutro Abs: 2.8 10*3/uL (ref 1.4–7.7)
Neutrophils Relative %: 56.2 % (ref 43.0–77.0)
Platelets: 235 10*3/uL (ref 150.0–400.0)
RBC: 4.22 Mil/uL (ref 3.87–5.11)
RDW: 14.1 % (ref 11.5–15.5)
WBC: 4.9 10*3/uL (ref 4.0–10.5)

## 2019-01-01 ENCOUNTER — Other Ambulatory Visit: Payer: Self-pay | Admitting: Internal Medicine

## 2019-01-01 DIAGNOSIS — N3000 Acute cystitis without hematuria: Secondary | ICD-10-CM

## 2019-01-01 LAB — URINALYSIS, ROUTINE W REFLEX MICROSCOPIC
Bilirubin Urine: NEGATIVE
Glucose, UA: NEGATIVE
Hyaline Cast: NONE SEEN /LPF
Nitrite: NEGATIVE
Protein, ur: NEGATIVE
Specific Gravity, Urine: 1.022 (ref 1.001–1.03)
Squamous Epithelial / HPF: NONE SEEN /HPF (ref <=5)
pH: <=5 (ref 5.0–8.0)

## 2019-01-01 LAB — HEPATITIS B SURFACE ANTIBODY, QUANTITATIVE: Hep B S AB Quant (Post): >1000 m[IU]/mL (ref >=10)

## 2019-01-06 ENCOUNTER — Ambulatory Visit (INDEPENDENT_AMBULATORY_CARE_PROVIDER_SITE_OTHER): Payer: 59 | Admitting: Internal Medicine

## 2019-01-06 ENCOUNTER — Other Ambulatory Visit: Payer: Self-pay

## 2019-01-06 VITALS — Wt 178.0 lb

## 2019-01-06 DIAGNOSIS — F329 Major depressive disorder, single episode, unspecified: Secondary | ICD-10-CM

## 2019-01-06 DIAGNOSIS — I10 Essential (primary) hypertension: Secondary | ICD-10-CM

## 2019-01-06 DIAGNOSIS — Z0000 Encounter for general adult medical examination without abnormal findings: Secondary | ICD-10-CM | POA: Diagnosis not present

## 2019-01-06 DIAGNOSIS — F32A Depression, unspecified: Secondary | ICD-10-CM

## 2019-01-06 DIAGNOSIS — R319 Hematuria, unspecified: Secondary | ICD-10-CM

## 2019-01-06 DIAGNOSIS — Z1231 Encounter for screening mammogram for malignant neoplasm of breast: Secondary | ICD-10-CM | POA: Diagnosis not present

## 2019-01-06 DIAGNOSIS — R748 Abnormal levels of other serum enzymes: Secondary | ICD-10-CM

## 2019-01-06 DIAGNOSIS — N200 Calculus of kidney: Secondary | ICD-10-CM

## 2019-01-06 DIAGNOSIS — R799 Abnormal finding of blood chemistry, unspecified: Secondary | ICD-10-CM

## 2019-01-06 DIAGNOSIS — E785 Hyperlipidemia, unspecified: Secondary | ICD-10-CM

## 2019-01-06 MED ORDER — DULOXETINE HCL 30 MG PO CPEP: 30.0000 mg | ORAL_CAPSULE | Freq: Every day | ORAL | 3 refills | Status: DC

## 2019-01-06 MED ORDER — DULOXETINE HCL 60 MG PO CPEP: 60.0000 mg | ORAL_CAPSULE | Freq: Every day | ORAL | 3 refills | Status: DC

## 2019-01-06 NOTE — Progress Notes (Signed)
Virtual Visit via Video Note  I connected with Melanie Ballard   on 01/06/19 at  8:00 AM EDT by a video enabled telemedicine application and verified that I am speaking with the correct person using two identifiers.  Location patient: work  Location provider:work or home office Persons participating in the virtual visit: patient, provider  I discussed the limitations of evaluation and management by telemedicine and the availability of in person appointments. The patient expressed understanding and agreed to proceed.   HPI: 1. Annual reviewed labs AP 119 BUN 26 (SPEP and UPEP negative in 2019) TC 235 and LDL 152 blood in urine w/o UTI sx's but h/o kidney stones noted CT 2018   she is overall feeling well BP few weeks ago at dentist was 114/87 and wt now 178 lbs and she is doing keto diet  2. HTN on norvasc 5 mg qd  3. Depression wants refill of cymbalta 90 mg qd   ROS: See pertinent positives and negatives per HPI. General: wt loss 178 lbs trying  HEENT: no sore throat  CV: no chest pain  Lungs: no sob  GU: no burning/frequency urination  GI: no ab pain  MSK: no jt pain  Neuro: no h/a  Skin: no issues  Psych: depression stable   Past Medical History:  Diagnosis Date  . Allergy   . Depression   . History of blood transfusion    after hysterectomy complication in 1998 where bladder laceration and severe infection   . History of chickenpox   . Hyperparathyroidism (HCC)   . Kidney stone   . Kidney stones   . Migraines   . UTI (urinary tract infection)    recurrent x 5 not had since 2014     Past Surgical History:  Procedure Laterality Date  . ABDOMINAL HYSTERECTOMY     reason DUB; no h/o abnormal Pap. Dr. [facility] still has ovaries and FT intact not cervix   . CESAREAN SECTION     1994  . CHOLECYSTECTOMY     1993  . CYSTOSCOPY WITH STENT PLACEMENT Left 09/12/2016   Procedure: CYSTOSCOPY WITH STENT PLACEMENT;  Surgeon: Brian James Budzyn, MD;  Location: [facility];   Service: Urology;  Laterality: Left;  . KIDNEY STONE SURGERY    . MENISCUS REPAIR     b/l knees   . thermal hydroablation     DUB  . TONSILLECTOMY    . URETEROSCOPY WITH HOLMIUM LASER LITHOTRIPSY Left 09/12/2016   Procedure: URETEROSCOPY WITH HOLMIUM LASER LITHOTRIPSY;  Surgeon: Brian James Budzyn, MD;  Location: [facility];  Service: Urology;  Laterality: Left;    Family History  Problem Relation Age of Onset  . Arthritis Mother   . Depression Mother   . Diabetes Mother   . Hearing loss Mother   . Stroke Mother   . Early death Father   . Heart disease Father        died age 50 mi   . Hyperlipidemia Father   . Hypertension Father   . COPD Sister   . Depression Sister   . Diabetes Sister   . Early death Sister   . Hyperlipidemia Sister   . Hypertension Sister   . Cancer Maternal Grandfather        lung smoker   . Heart disease Paternal Grandfather   . Depression Sister   . Diabetes Sister   . Diabetes Sister   . Prostate cancer Neg Hx   . Kidney cancer Neg Hx   .   Bladder Cancer Neg Hx     SOCIAL HX:  Married  2 girls 1 boy  Research scientist (life sciences)  Never smoker  No guns  Safe in relationship     Current Outpatient Medications:  .  amLODipine (NORVASC) 5 MG tablet, Take 1 tablet (5 mg total) by mouth daily. In am, Disp: 90 tablet, Rfl: 3 .  Cholecalciferol (EQL VITAMIN D3 GUMMIES PO), Take 2 each by mouth daily. , Disp: , Rfl:  .  DULoxetine (CYMBALTA) 30 MG capsule, Take 1 capsule (30 mg total) by mouth daily. +60=90 mg qd, Disp: 90 capsule, Rfl: 3 .  DULoxetine (CYMBALTA) 60 MG capsule, Take 1 capsule (60 mg total) by mouth daily. +30=90 mg daily, Disp: 90 capsule, Rfl: 3 .  fluticasone (FLONASE) 50 MCG/ACT nasal spray, Place 2 sprays into both nostrils daily. As needed, Disp: 16 g, Rfl: 11 .  MELATONIN PO, Take by mouth., Disp: , Rfl:  .  montelukast (SINGULAIR) 10 MG tablet, Take 1 tablet (10 mg total) by mouth at bedtime., Disp: 90 tablet, Rfl: 3  EXAM:  VITALS  per patient if applicable:  GENERAL: alert, oriented, appears well and in no acute distress  HEENT: atraumatic, conjunttiva clear, no obvious abnormalities on inspection of external nose and ears  NECK: normal movements of the head and neck  LUNGS: on inspection no signs of respiratory distress, breathing rate appears normal, no obvious gross SOB, gasping or wheezing  CV: no obvious cyanosis  MS: moves all visible extremities without noticeable abnormality  PSYCH/NEURO: pleasant and cooperative, no obvious depression or anxiety, speech and thought processing grossly intact  ASSESSMENT AND PLAN:  Discussed the following assessment and plan:  Annual physical exam - Plan:  Declines flu shot  Tdaputd  rec MMR vaccine given Rx today  Declines HIV testing  Hep B immune after 2/2 new doses   S/p hysterectomy DUB no h/o abnormal pap ovaries and FT intact no cervix will not do further Mammogram 01/20/18 negativeordered  Colonoscopy see report 07/29/16 f/u in 10 years Never smoker  No need for dermatology now  Cont healthy diet and exercise   Depression, unspecified depression type - Plan: DULoxetine (CYMBALTA) 60 MG capsule, DULoxetine (CYMBALTA) 30 MG capsule   Essential hypertension/HLD - Plan: Comprehensive metabolic panel -rec healthy diet and exercise and monitor BP  Disc nutrim otc supplement  Disc goals of LDL and TC   Hematuria, unspecified type - Plan: urine culture Friday  Kidney stones - Plan: if urine culture negative consider CT renal with h/o kideny stones   Elevated BUN - Plan: Comprehensive metabolic panel, Antinuclear Antib (ANA), see above hematuria section SPEP and UPEP negative   Elevated alkaline phosphatase level - Plan: Comprehensive metabolic panel, Gamma GT in 3 months    I discussed the assessment and treatment plan with the patient. The patient was provided an opportunity to ask questions and all were answered. The patient agreed with the plan and  demonstrated an understanding of the instructions.   The patient was advised to call back or seek an in-person evaluation if the symptoms worsen or if the condition fails to improve as anticipated.  Time spent 15 minutes  Delorise Jackson, MD

## 2019-01-08 ENCOUNTER — Other Ambulatory Visit: Payer: 59

## 2019-01-14 ENCOUNTER — Encounter: Payer: Self-pay | Admitting: Internal Medicine

## 2019-04-07 ENCOUNTER — Other Ambulatory Visit: Payer: Self-pay

## 2019-04-09 ENCOUNTER — Other Ambulatory Visit: Payer: Self-pay

## 2019-04-09 ENCOUNTER — Other Ambulatory Visit (INDEPENDENT_AMBULATORY_CARE_PROVIDER_SITE_OTHER): Payer: Managed Care, Other (non HMO)

## 2019-04-09 DIAGNOSIS — I10 Essential (primary) hypertension: Secondary | ICD-10-CM | POA: Diagnosis not present

## 2019-04-09 DIAGNOSIS — R748 Abnormal levels of other serum enzymes: Secondary | ICD-10-CM | POA: Diagnosis not present

## 2019-04-09 DIAGNOSIS — N3000 Acute cystitis without hematuria: Secondary | ICD-10-CM

## 2019-04-09 DIAGNOSIS — R799 Abnormal finding of blood chemistry, unspecified: Secondary | ICD-10-CM | POA: Diagnosis not present

## 2019-04-09 LAB — COMPREHENSIVE METABOLIC PANEL
ALT: 27 U/L (ref 0–35)
AST: 26 U/L (ref 0–37)
Albumin: 4.6 g/dL (ref 3.5–5.2)
Alkaline Phosphatase: 128 U/L — ABNORMAL HIGH (ref 39–117)
BUN: 29 mg/dL — ABNORMAL HIGH (ref 6–23)
CO2: 26 mEq/L (ref 19–32)
Calcium: 9.1 mg/dL (ref 8.4–10.5)
Chloride: 105 mEq/L (ref 96–112)
Creatinine, Ser: 1.11 mg/dL (ref 0.40–1.20)
GFR: 51.44 mL/min — ABNORMAL LOW (ref >60.00)
Glucose, Bld: 111 mg/dL — ABNORMAL HIGH (ref 70–99)
Potassium: 4.1 mEq/L (ref 3.5–5.1)
Sodium: 141 mEq/L (ref 135–145)
Total Bilirubin: 0.4 mg/dL (ref 0.2–1.2)
Total Protein: 7 g/dL (ref 6.0–8.3)

## 2019-04-09 LAB — GAMMA GT: GGT: 71 U/L — ABNORMAL HIGH (ref 7–51)

## 2019-04-11 LAB — URINE CULTURE
MICRO NUMBER:: 1049164
Result:: NO GROWTH
SPECIMEN QUALITY:: ADEQUATE

## 2019-04-13 LAB — ANTI-NUCLEAR AB-TITER (ANA TITER): ANA Titer 1: 1:40 {titer} — ABNORMAL HIGH

## 2019-04-13 LAB — ANA: Anti Nuclear Antibody (ANA): POSITIVE — AB

## 2019-05-12 ENCOUNTER — Other Ambulatory Visit: Payer: Self-pay

## 2019-05-12 ENCOUNTER — Ambulatory Visit (INDEPENDENT_AMBULATORY_CARE_PROVIDER_SITE_OTHER): Payer: Managed Care, Other (non HMO) | Admitting: Internal Medicine

## 2019-05-12 ENCOUNTER — Encounter: Payer: Self-pay | Admitting: Internal Medicine

## 2019-05-12 VITALS — Ht 64.0 in | Wt 175.0 lb

## 2019-05-12 DIAGNOSIS — R768 Other specified abnormal immunological findings in serum: Secondary | ICD-10-CM

## 2019-05-12 DIAGNOSIS — R748 Abnormal levels of other serum enzymes: Secondary | ICD-10-CM

## 2019-05-12 DIAGNOSIS — I10 Essential (primary) hypertension: Secondary | ICD-10-CM

## 2019-05-12 DIAGNOSIS — E785 Hyperlipidemia, unspecified: Secondary | ICD-10-CM

## 2019-05-12 DIAGNOSIS — J309 Allergic rhinitis, unspecified: Secondary | ICD-10-CM

## 2019-05-12 MED ORDER — MONTELUKAST SODIUM 10 MG PO TABS: 10.0000 mg | ORAL_TABLET | Freq: Every day | ORAL | 3 refills | Status: DC

## 2019-05-12 NOTE — Progress Notes (Signed)
Virtual Visit via Video Note  I connected with Melanie Ballard   on 05/12/19 at  8:13 AM EST by a video enabled telemedicine application and verified that I am speaking with the correct person using two identifiers.  Location patient:work Location provider:work or home office Persons participating in the virtual visit: patient, provider  I discussed the limitations of evaluation and management by telemedicine and the availability of in person appointments. The patient expressed understanding and agreed to proceed.   HPI: 1. HTN not checked BP on norvasc 5 mg qd at dental consult with [facility] in [facility] [place]  [place] fax [phone] she reports her BP was high 170/115 due to stress at home and had not had meds and traffic appt for 1 dental implant is 06/17/2019 2. Mood controlled on cymbalta 90 mg qd  3. Allergies better with singulair 10 wants refill  4. Abnormal labs + AP, GGt s/p gb removal, elevated BUN and Cr. And ANA 1:40 titer wants to wait on US abdomen for now and denies joint pain she thinks keto diet changed her labs and wants to recheck     ROS: See pertinent positives and negatives per HPI.  Past Medical History:  Diagnosis Date  . Allergy   . Depression   . History of blood transfusion    after hysterectomy complication in 2585 where bladder laceration and severe infection   . History of chickenpox   . Hyperparathyroidism ([place])   . Kidney stone   . Kidney stones   . Migraines   . UTI (urinary tract infection)    recurrent x 5 not had since 2014     Past Surgical History:  Procedure Laterality Date  . ABDOMINAL HYSTERECTOMY     reason DUB; no h/o abnormal Pap. Dr. [facility] still has ovaries and FT intact not cervix   . CESAREAN SECTION     1994  . CHOLECYSTECTOMY     1993  . CYSTOSCOPY WITH STENT PLACEMENT Left 09/12/2016   Procedure: CYSTOSCOPY WITH STENT PLACEMENT;  Surgeon: Nickie Retort, MD;  Location: [facility];  Service: Urology;   Laterality: Left;  . KIDNEY STONE SURGERY    . MENISCUS REPAIR     b/l knees   . thermal hydroablation     DUB  . TONSILLECTOMY    . URETEROSCOPY WITH HOLMIUM LASER LITHOTRIPSY Left 09/12/2016   Procedure: URETEROSCOPY WITH HOLMIUM LASER LITHOTRIPSY;  Surgeon: Nickie Retort, MD;  Location: [facility];  Service: Urology;  Laterality: Left;    Family History  Problem Relation Age of Onset  . Arthritis Mother   . Depression Mother   . Diabetes Mother   . Hearing loss Mother   . Stroke Mother   . Early death Father   . Heart disease Father        died age 63 mi   . Hyperlipidemia Father   . Hypertension Father   . COPD Sister   . Depression Sister   . Diabetes Sister   . Early death Sister   . Hyperlipidemia Sister   . Hypertension Sister   . Cancer Maternal Grandfather        lung smoker   . Heart disease Paternal Grandfather   . Depression Sister   . Diabetes Sister   . Diabetes Sister   . Prostate cancer Neg Hx   . Kidney cancer Neg Hx   . 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  amLODipine (NORVASC) 5 MG tablet, Take 1 tablet (5 mg total) by mouth daily. In am, Disp: 90 tablet, Rfl: 3 .  Cholecalciferol (EQL VITAMIN D3 GUMMIES PO), Take 2 each by mouth daily. , Disp: , Rfl:  .  DULoxetine (CYMBALTA) 30 MG capsule, Take 1 capsule (30 mg total) by mouth daily. +60=90 mg qd, Disp: 90 capsule, Rfl: 3 .  DULoxetine (CYMBALTA) 60 MG capsule, Take 1 capsule (60 mg total) by mouth daily. +30=90 mg daily, Disp: 90 capsule, Rfl: 3 .  fluticasone (FLONASE) 50 MCG/ACT nasal spray, Place 2 sprays into both nostrils daily. As needed, Disp: 16 g, Rfl: 11 .  MELATONIN PO, Take by mouth., Disp: , Rfl:  .  montelukast (SINGULAIR) 10 MG tablet, Take 1 tablet (10 mg total) by mouth at bedtime., Disp: 90 tablet, Rfl: 3  EXAM:  VITALS per patient if  applicable:  GENERAL: alert, oriented, appears well and in no acute distress  HEENT: atraumatic, conjunttiva clear, no obvious abnormalities on inspection of external nose and ears  NECK: normal movements of the head and neck  LUNGS: on inspection no signs of respiratory distress, breathing rate appears normal, no obvious gross SOB, gasping or wheezing  CV: no obvious cyanosis  MS: moves all visible extremities without noticeable abnormality  PSYCH/NEURO: pleasant and cooperative, no obvious depression or anxiety, speech and thought processing grossly intact  ASSESSMENT AND PLAN:  Discussed the following assessment and plan:  Essential hypertension - Plan: Comprehensive metabolic panel, CBC with Differential/Platelet Cont meds monitor BP and my chart in 2 weeks   Allergic rhinitis, unspecified seasonality, unspecified trigger - Plan: montelukast (SINGULAIR) 10 MG tablet  Alkaline phosphatase elevation - Plan: Comprehensive metabolic panel, Mitochondrial antibodies, Anti-smooth muscle antibody, IgG Consider US abdomen in future   ANA positive - Plan: Mitochondrial antibodies, Anti-smooth muscle antibody, IgG  Hyperlipidemia, unspecified hyperlipidemia type - Plan: Lipid panel -recheck 07/2019   HM Declines flu shot Tdaputd  rec MMR vaccinegiven Rx today Declines HIV testing  Hep B immune after 2/2 new doses   S/p hysterectomy DUB no h/o abnormal pap ovaries and FT intact no cervix will not do further Mammogram 01/20/18 negativeordered pt to call to schedule  Colonoscopysee report 07/29/16 f/u in 10 years Never smoker  No need for dermatology now  Cont healthy diet and exercise  -we discussed possible serious and likely etiologies, options for evaluation and workup, limitations of telemedicine visit vs in person visit, treatment, treatment risks and precautions. Pt prefers to treat via telemedicine empirically rather then risking or undertaking an in person visit  at this moment. Patient agrees to seek prompt in person care if worsening, new symptoms arise, or if is not improving with treatment.   I discussed the assessment and treatment plan with the patient. The patient was provided an opportunity to ask questions and all were answered. The patient agreed with the plan and demonstrated an understanding of the instructions.   The patient was advised to call back or seek an in-person evaluation if the symptoms worsen or if the condition fails to improve as anticipated.  Time spent 15-20 minutes Delorise Jackson, MD

## 2019-05-18 ENCOUNTER — Encounter: Payer: Self-pay | Admitting: Internal Medicine

## 2019-05-18 ENCOUNTER — Other Ambulatory Visit: Payer: Self-pay | Admitting: Internal Medicine

## 2019-05-18 DIAGNOSIS — I10 Essential (primary) hypertension: Secondary | ICD-10-CM

## 2019-05-18 MED ORDER — HYDROCHLOROTHIAZIDE 12.5 MG PO TABS: 12.5000 mg | ORAL_TABLET | Freq: Every day | ORAL | 3 refills | Status: DC

## 2019-06-28 ENCOUNTER — Ambulatory Visit
Admission: RE | Admit: 2019-06-28 | Discharge: 2019-06-28 | Disposition: A | Discharge status: Home / Self Care | Payer: Managed Care, Other (non HMO) | Source: Ambulatory Visit | Attending: Internal Medicine | Admitting: Internal Medicine

## 2019-06-28 DIAGNOSIS — Z1231 Encounter for screening mammogram for malignant neoplasm of breast: Secondary | ICD-10-CM | POA: Insufficient documentation

## 2019-08-12 ENCOUNTER — Other Ambulatory Visit: Payer: Self-pay

## 2019-08-12 ENCOUNTER — Other Ambulatory Visit (INDEPENDENT_AMBULATORY_CARE_PROVIDER_SITE_OTHER): Payer: Managed Care, Other (non HMO)

## 2019-08-12 DIAGNOSIS — R748 Abnormal levels of other serum enzymes: Secondary | ICD-10-CM | POA: Diagnosis not present

## 2019-08-12 DIAGNOSIS — R768 Other specified abnormal immunological findings in serum: Secondary | ICD-10-CM

## 2019-08-12 DIAGNOSIS — I10 Essential (primary) hypertension: Secondary | ICD-10-CM

## 2019-08-12 DIAGNOSIS — E785 Hyperlipidemia, unspecified: Secondary | ICD-10-CM

## 2019-08-12 DIAGNOSIS — R7689 Other specified abnormal immunological findings in serum: Secondary | ICD-10-CM

## 2019-08-12 LAB — COMPREHENSIVE METABOLIC PANEL
ALT: 47 U/L — ABNORMAL HIGH (ref 0–35)
AST: 33 U/L (ref 0–37)
Albumin: 4.1 g/dL (ref 3.5–5.2)
Alkaline Phosphatase: 131 U/L — ABNORMAL HIGH (ref 39–117)
BUN: 23 mg/dL (ref 6–23)
CO2: 27 mEq/L (ref 19–32)
Calcium: 8.9 mg/dL (ref 8.4–10.5)
Chloride: 105 mEq/L (ref 96–112)
Creatinine, Ser: 1.02 mg/dL (ref 0.40–1.20)
GFR: 56.64 mL/min — ABNORMAL LOW (ref >60.00)
Glucose, Bld: 117 mg/dL — ABNORMAL HIGH (ref 70–99)
Potassium: 4.2 mEq/L (ref 3.5–5.1)
Sodium: 139 mEq/L (ref 135–145)
Total Bilirubin: 0.5 mg/dL (ref 0.2–1.2)
Total Protein: 6.4 g/dL (ref 6.0–8.3)

## 2019-08-12 LAB — LIPID PANEL
Cholesterol: 243 mg/dL — ABNORMAL HIGH (ref 0–200)
HDL: 68.8 mg/dL (ref >39.00)
LDL Cholesterol: 156 mg/dL — ABNORMAL HIGH (ref 0–99)
NonHDL: 173.87
Total CHOL/HDL Ratio: 4
Triglycerides: 89 mg/dL (ref 0.0–149.0)
VLDL: 17.8 mg/dL (ref 0.0–40.0)

## 2019-08-12 LAB — CBC WITH DIFFERENTIAL/PLATELET
Basophils Absolute: 0.1 10*3/uL (ref 0.0–0.1)
Basophils Relative: 1 % (ref 0.0–3.0)
Eosinophils Absolute: 0.1 10*3/uL (ref 0.0–0.7)
Eosinophils Relative: 2.2 % (ref 0.0–5.0)
HCT: 36.6 % (ref 36.0–46.0)
Hemoglobin: 12.6 g/dL (ref 12.0–15.0)
Lymphocytes Relative: 30.9 % (ref 12.0–46.0)
Lymphs Abs: 1.7 10*3/uL (ref 0.7–4.0)
MCHC: 34.5 g/dL (ref 30.0–36.0)
MCV: 90.7 fl (ref 78.0–100.0)
Monocytes Absolute: 0.4 10*3/uL (ref 0.1–1.0)
Monocytes Relative: 6.9 % (ref 3.0–12.0)
Neutro Abs: 3.3 10*3/uL (ref 1.4–7.7)
Neutrophils Relative %: 59 % (ref 43.0–77.0)
Platelets: 277 10*3/uL (ref 150.0–400.0)
RBC: 4.03 Mil/uL (ref 3.87–5.11)
RDW: 14.1 % (ref 11.5–15.5)
WBC: 5.6 10*3/uL (ref 4.0–10.5)

## 2019-08-13 ENCOUNTER — Other Ambulatory Visit: Payer: Self-pay | Admitting: Internal Medicine

## 2019-08-13 ENCOUNTER — Other Ambulatory Visit (INDEPENDENT_AMBULATORY_CARE_PROVIDER_SITE_OTHER): Payer: Managed Care, Other (non HMO)

## 2019-08-13 DIAGNOSIS — R739 Hyperglycemia, unspecified: Secondary | ICD-10-CM

## 2019-08-13 LAB — HEMOGLOBIN A1C: Hgb A1c MFr Bld: 5.5 % (ref 4.6–6.5)

## 2019-08-17 LAB — ANTI-SMOOTH MUSCLE ANTIBODY, IGG: Actin (Smooth Muscle) Antibody (IGG): <20 U (ref <20)

## 2019-08-17 LAB — MITOCHONDRIAL ANTIBODIES: Mitochondrial M2 Ab, IgG: <=20 U

## 2019-08-19 ENCOUNTER — Telehealth (INDEPENDENT_AMBULATORY_CARE_PROVIDER_SITE_OTHER): Payer: Managed Care, Other (non HMO) | Admitting: Internal Medicine

## 2019-08-19 ENCOUNTER — Encounter: Payer: Self-pay | Admitting: Internal Medicine

## 2019-08-19 VITALS — BP 130/77 | Ht 64.0 in | Wt 175.0 lb

## 2019-08-19 DIAGNOSIS — I10 Essential (primary) hypertension: Secondary | ICD-10-CM | POA: Diagnosis not present

## 2019-08-19 DIAGNOSIS — F419 Anxiety disorder, unspecified: Secondary | ICD-10-CM

## 2019-08-19 DIAGNOSIS — R7989 Other specified abnormal findings of blood chemistry: Secondary | ICD-10-CM | POA: Diagnosis not present

## 2019-08-19 DIAGNOSIS — H6993 Unspecified Eustachian tube disorder, bilateral: Secondary | ICD-10-CM

## 2019-08-19 DIAGNOSIS — E785 Hyperlipidemia, unspecified: Secondary | ICD-10-CM | POA: Diagnosis not present

## 2019-08-19 DIAGNOSIS — F329 Major depressive disorder, single episode, unspecified: Secondary | ICD-10-CM

## 2019-08-19 MED ORDER — FLUTICASONE PROPIONATE 50 MCG/ACT NA SUSP: 2.0000 | Freq: Every day | NASAL | 11 refills | Status: DC

## 2019-08-19 MED ORDER — AMLODIPINE BESYLATE 5 MG PO TABS: 5.0000 mg | ORAL_TABLET | Freq: Every day | ORAL | 3 refills | Status: DC

## 2019-08-19 MED ORDER — ATORVASTATIN CALCIUM 10 MG PO TABS: 10.0000 mg | ORAL_TABLET | Freq: Every day | ORAL | 3 refills | Status: DC

## 2019-08-19 NOTE — Progress Notes (Signed)
See office note

## 2019-08-19 NOTE — Progress Notes (Addendum)
Telephone Note  I connected with Rashan Patient   on 08/19/19 at  8:00 AM EST by telephone verified that I am speaking with the correct person using two identifiers. Location patient: car Location provider:work or home office Persons participating in the virtual visit: patient, provider  I discussed the limitations of evaluation and management by telemedicine and the availability of in person appointments. The patient expressed understanding and agreed to proceed.   HPI: HTN improved norvasc 5 mg qd   HLD agreeable to start statin   Elevated ap 131 and ALT 47 she is not drinking etoh s/p GB removal and this was prior to starting singulair  H/o +ANA titer 1:40 but AMA negative and antismooth muscle negative  Agreeable to US abdomen   H/o anxiety/depression on cymbalta 90 mg qd PHQ 9 score 3 and GAD 7 score 3 today brother in law on life support in [place] and work stressors   2 weeks will have more dental work with partial dental device fixed to permanent    ROS: See pertinent positives and negatives per HPI.  Past Medical History:  Diagnosis Date  . Allergy   . Depression   . History of blood transfusion    after hysterectomy complication in 3295 where bladder laceration and severe infection   . History of chickenpox   . HLD (hyperlipidemia)   . 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 Maudie Mercury  . Kidney stone   . Kidney stones   . Migraines   . UTI (urinary tract infection)    recurrent x 5 not had since 2014     Past Surgical History:  Procedure Laterality Date  . ABDOMINAL HYSTERECTOMY     reason DUB; no h/o abnormal Pap. Dr. Vilinda [facility] still has ovaries and FT intact not cervix   . CESAREAN SECTION     1994  . CHOLECYSTECTOMY     1993  . CYSTOSCOPY WITH STENT PLACEMENT Left 09/12/2016   Procedure: CYSTOSCOPY WITH STENT PLACEMENT;  Surgeon: Nickie Retort, MD;  Location: [facility];  Service: Urology;   Laterality: Left;  . KIDNEY STONE SURGERY    . MENISCUS REPAIR     b/l knees   . PARATHYROIDECTOMY     right sided parathyroid adenoma now s/p parathyroidectomy on 05/29/18 Dr. Adonis Huguenin  . thermal hydroablation     DUB  . TONSILLECTOMY    . URETEROSCOPY WITH HOLMIUM LASER LITHOTRIPSY Left 09/12/2016   Procedure: URETEROSCOPY WITH HOLMIUM LASER LITHOTRIPSY;  Surgeon: Nickie Retort, MD;  Location: [facility];  Service: Urology;  Laterality: Left;    Family History  Problem Relation Age of Onset  . Arthritis Mother   . Depression Mother   . Diabetes Mother   . Hearing loss Mother   . Stroke Mother   . Early death Father   . Heart disease Father        died age 36 mi   . Hyperlipidemia Father   . Hypertension Father   . COPD Sister   . Depression Sister   . Diabetes Sister   . Early death Sister   . Hyperlipidemia Sister   . Hypertension Sister   . Cancer Maternal Grandfather        lung smoker   . Heart disease Paternal Grandfather   . Depression Sister   . Diabetes Sister   . Diabetes Sister   . Prostate cancer Neg Hx   .  Kidney cancer Neg Hx   . 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  amLODipine (NORVASC) 5 MG tablet, Take 1 tablet (5 mg total) by mouth daily. In am, Disp: 90 tablet, Rfl: 3 .  DULoxetine (CYMBALTA) 30 MG capsule, Take 1 capsule (30 mg total) by mouth daily. +60=90 mg qd, Disp: 90 capsule, Rfl: 3 .  DULoxetine (CYMBALTA) 60 MG capsule, Take 1 capsule (60 mg total) by mouth daily. +30=90 mg daily, Disp: 90 capsule, Rfl: 3 .  fluticasone (FLONASE) 50 MCG/ACT nasal spray, Place 2 sprays into both nostrils daily. As needed, Disp: 16 g, Rfl: 11 .  hydrochlorothiazide (HYDRODIURIL) 12.5 MG tablet, Take 1 tablet (12.5 mg total) by mouth daily. In am, Disp: 90 tablet, Rfl: 3 .  MELATONIN PO, Take by mouth., Disp: , Rfl:  .   montelukast (SINGULAIR) 10 MG tablet, Take 1 tablet (10 mg total) by mouth at bedtime., Disp: 90 tablet, Rfl: 3 .  atorvastatin (LIPITOR) 10 MG tablet, Take 1 tablet (10 mg total) by mouth daily at 6 PM., Disp: 90 tablet, Rfl: 3  EXAM:  VITALS per patient if applicable:  GENERAL: alert, oriented, appears well and in no acute distress  PSYCH/NEURO: pleasant and cooperative, no obvious depression or anxiety, speech and thought processing grossly intact  ASSESSMENT AND PLAN:  Discussed the following assessment and plan:  Essential hypertension - Plan: amLODipine (NORVASC) 5 MG tablet Improved cont meds and monitoring BP   Hyperlipidemia, unspecified hyperlipidemia type - Plan: atorvastatin (LIPITOR) 10 MG tablet  Elevated liver function tests - Plan: US Abdomen Complete [facility] reply 10/2019  Gwynneth Munson from [facility] called, [phone]. Their office reached out to patient on September 01, 2019 and never heard back from patient. A doctor at [facility] reviewed patient's chart and said unless patient is having abdominal pain this issue is normal. Patient is not due for a colonoscopy until 2028.  Anxiety and depression phq9 score 3 and GAD 7 score 3 Cont cymbalta 90 mg qd   HM Declines flu shot Tdaputd  rec MMR vaccinegiven Rx previously  Declines HIV testing Hep B immune after 2/2 new doses  S/p hysterectomy DUB no h/o abnormal pap ovaries and FT intact no cervix will not do further Mammogram 06/28/19 negative Colonoscopysee report 07/29/16 f/u in 10 years  Never smoker  No need for dermatology now  -we discussed possible serious and likely etiologies, options for evaluation and workup, limitations of telemedicine visit vs in person visit, treatment, treatment risks and precautions. Pt prefers to treat via telemedicine empirically rather then risking or undertaking an in person visit at this moment. Patient agrees to seek prompt in person care if worsening, new symptoms arise,  or if is not improving with treatment.   I discussed the assessment and treatment plan with the patient. The patient was provided an opportunity to ask questions and all were answered. The patient agreed with the plan and demonstrated an understanding of the instructions.   The patient was advised to call back or seek an in-person evaluation if the symptoms worsen or if the condition fails to improve as anticipated.  Time spent 20 minutes Delorise Jackson, MD

## 2019-08-24 ENCOUNTER — Ambulatory Visit: Payer: Managed Care, Other (non HMO)

## 2019-08-26 ENCOUNTER — Other Ambulatory Visit: Payer: Self-pay

## 2019-08-26 ENCOUNTER — Ambulatory Visit
Admission: RE | Admit: 2019-08-26 | Discharge: 2019-08-26 | Disposition: A | Discharge status: Home / Self Care | Payer: Managed Care, Other (non HMO) | Source: Ambulatory Visit | Attending: Internal Medicine | Admitting: Internal Medicine

## 2019-08-26 ENCOUNTER — Telehealth: Payer: Self-pay | Admitting: Internal Medicine

## 2019-08-26 DIAGNOSIS — R7989 Other specified abnormal findings of blood chemistry: Secondary | ICD-10-CM | POA: Insufficient documentation

## 2019-08-26 NOTE — Telephone Encounter (Signed)
-----   Message from Bevelyn Buckles, MD sent at 08/26/2019  3:39 PM EDT ----- Elevated liver enzymes and Common bile duct slightly dilated 10 mm normal is 4-6 mm hers is 10 mm after she had gall bladder removed.  Fax [place] [facility] in [place]  Need reply to see if they are worried about this or want to see pt for f/u  ATTN Dr. Randa Evens  Results for Melanie, Ballard (MRN [id number]) as of 08/26/2019 15:37  10/11/2013 01:32 Alkaline Phosphatase: 111 Albumin: 4.3 AST: 29 ALT: 31 Total Protein: 7.3 Total Bilirubin: 0.4  09/06/2016 09:20 Alkaline Phosphatase: 137 (H) Albumin: 4.9 Lipase: 11 AST: 35 ALT: 29 Total Protein: 8.1 Total Bilirubin: 1.0  09/11/2016 11:19  12/23/2017 16:08 Alkaline Phosphatase: 149 (H) Albumin: 4.8 AST: 23 ALT: 28 Total Protein: 7.5 Total Bilirubin: 0.5  01/02/2018 11:14 Total Protein: 6.9 GGT: 35  12/31/2018 08:00 Alkaline Phosphatase: 119 (H) Albumin: 4.6 AST: 31 ALT: 34 Total Protein: 6.6 Total Bilirubin: 0.5  04/09/2019 08:00 Alkaline Phosphatase: 128 (H) Albumin: 4.6 AST: 26 ALT: 27 Total Protein: 7.0 Total Bilirubin: 0.4 GGT: 71 (H)  08/12/2019 08:01 Alkaline Phosphatase: 131 (H) Albumin: 4.1 AST: 33 ALT: 47 (H) Total Protein: 6.4 Total Bilirubin: 0.5

## 2019-08-26 NOTE — Telephone Encounter (Signed)
Faxed

## 2019-09-27 NOTE — Telephone Encounter (Signed)
No please fax again   Thanks TMS

## 2019-09-27 NOTE — Telephone Encounter (Signed)
Did you receive this reply?

## 2019-10-11 NOTE — Telephone Encounter (Signed)
Faxed via Epic routing °

## 2019-10-19 NOTE — Telephone Encounter (Signed)
Miguel Aschoff from [facility] called, [phone]. Their office reached out to patient on September 01, 2019 and never heard back from patient. A doctor at [facility] reviewed patient's chart and said unless patient is having abdominal pain this issue is normal. Patient is not due for a colonoscopy until 2028.

## 2019-10-19 NOTE — Telephone Encounter (Signed)
For your information  

## 2019-10-20 NOTE — Telephone Encounter (Signed)
Have pt call [facility] if she has abdominal pain and history of elevated liver enzymes  They reached out to her but did not hear back  You may send my chart message   TMS

## 2019-10-21 NOTE — Telephone Encounter (Signed)
Patient informed and verbalized understanding.  States she never had abdominal pain. Patient also inquired on the results of her last ultrasound. Informed the Patient that Dr French Ana would still like for her to follow up due to elevated liver enzymes and slight bile duct dilation on her 08/26/19 Ultrasound.   Patient given the number, [phone], and will call to schedule.

## 2020-02-22 ENCOUNTER — Telehealth: Payer: Managed Care, Other (non HMO) | Admitting: Internal Medicine

## 2020-03-07 ENCOUNTER — Encounter: Payer: Self-pay | Admitting: Internal Medicine

## 2020-03-07 ENCOUNTER — Other Ambulatory Visit: Payer: Self-pay

## 2020-03-07 ENCOUNTER — Telehealth (INDEPENDENT_AMBULATORY_CARE_PROVIDER_SITE_OTHER): Payer: Managed Care, Other (non HMO) | Admitting: Internal Medicine

## 2020-03-07 VITALS — BP 112/70 | Ht 64.0 in | Wt 170.0 lb

## 2020-03-07 DIAGNOSIS — J309 Allergic rhinitis, unspecified: Secondary | ICD-10-CM | POA: Diagnosis not present

## 2020-03-07 DIAGNOSIS — R748 Abnormal levels of other serum enzymes: Secondary | ICD-10-CM

## 2020-03-07 DIAGNOSIS — H6993 Unspecified Eustachian tube disorder, bilateral: Secondary | ICD-10-CM

## 2020-03-07 DIAGNOSIS — I10 Essential (primary) hypertension: Secondary | ICD-10-CM

## 2020-03-07 DIAGNOSIS — F329 Major depressive disorder, single episode, unspecified: Secondary | ICD-10-CM

## 2020-03-07 DIAGNOSIS — Z1231 Encounter for screening mammogram for malignant neoplasm of breast: Secondary | ICD-10-CM | POA: Diagnosis not present

## 2020-03-07 DIAGNOSIS — F32A Depression, unspecified: Secondary | ICD-10-CM

## 2020-03-07 DIAGNOSIS — Z1329 Encounter for screening for other suspected endocrine disorder: Secondary | ICD-10-CM

## 2020-03-07 DIAGNOSIS — F419 Anxiety disorder, unspecified: Secondary | ICD-10-CM

## 2020-03-07 DIAGNOSIS — Z1389 Encounter for screening for other disorder: Secondary | ICD-10-CM

## 2020-03-07 DIAGNOSIS — E785 Hyperlipidemia, unspecified: Secondary | ICD-10-CM

## 2020-03-07 MED ORDER — HYDROCHLOROTHIAZIDE 12.5 MG PO TABS: 12.5000 mg | ORAL_TABLET | Freq: Every day | ORAL | 3 refills | Status: DC

## 2020-03-07 MED ORDER — MONTELUKAST SODIUM 10 MG PO TABS: 10.0000 mg | ORAL_TABLET | Freq: Every day | ORAL | 3 refills | Status: DC

## 2020-03-07 MED ORDER — ATORVASTATIN CALCIUM 10 MG PO TABS: 10.0000 mg | ORAL_TABLET | Freq: Every day | ORAL | 3 refills | Status: DC

## 2020-03-07 MED ORDER — FLUTICASONE PROPIONATE 50 MCG/ACT NA SUSP: 2.0000 | Freq: Every day | NASAL | 11 refills | Status: DC

## 2020-03-07 MED ORDER — AMLODIPINE BESYLATE 5 MG PO TABS: 5.0000 mg | ORAL_TABLET | Freq: Every day | ORAL | 3 refills | Status: DC

## 2020-03-07 MED ORDER — DULOXETINE HCL 30 MG PO CPEP: 30.0000 mg | ORAL_CAPSULE | Freq: Every day | ORAL | 3 refills | Status: DC

## 2020-03-07 MED ORDER — DULOXETINE HCL 60 MG PO CPEP: 60.0000 mg | ORAL_CAPSULE | Freq: Every day | ORAL | 3 refills | Status: DC

## 2020-03-07 NOTE — Patient Instructions (Signed)
Menopause Menopause is the normal time of life when menstrual periods stop completely. It is usually confirmed by 12 months without a menstrual period. The transition to menopause (perimenopause) most often happens between the ages of 45 and 55. During perimenopause, hormone levels change in your body, which can cause symptoms and affect your health. Menopause may increase your risk for:  Loss of bone (osteoporosis), which causes bone breaks (fractures).  Depression.  Hardening and narrowing of the arteries (atherosclerosis), which can cause heart attacks and strokes. What are the causes? This condition is usually caused by a natural change in hormone levels that happens as you get older. The condition may also be caused by surgery to remove both ovaries (bilateral oophorectomy). What increases the risk? This condition is more likely to start at an earlier age if you have certain medical conditions or treatments, including:  A tumor of the pituitary gland in the brain.  A disease that affects the ovaries and hormone production.  Radiation treatment for cancer.  Certain cancer treatments, such as chemotherapy or hormone (anti-estrogen) therapy.  Heavy smoking and excessive alcohol use.  Family history of early menopause. This condition is also more likely to develop earlier in women who are very thin. What are the signs or symptoms? Symptoms of this condition include:  Hot flashes.  Irregular menstrual periods.  Night sweats.  Changes in feelings about sex. This could be a decrease in sex drive or an increased comfort around your sexuality.  Vaginal dryness and thinning of the vaginal walls. This may cause painful intercourse.  Dryness of the skin and development of wrinkles.  Headaches.  Problems sleeping (insomnia).  Mood swings or irritability.  Memory problems.  Weight gain.  Hair growth on the face and chest.  Bladder infections or problems with urinating. How  is this diagnosed? This condition is diagnosed based on your medical history, a physical exam, your age, your menstrual history, and your symptoms. Hormone tests may also be done. How is this treated? In some cases, no treatment is needed. You and your health care provider should make a decision together about whether treatment is necessary. Treatment will be based on your individual condition and preferences. Treatment for this condition focuses on managing symptoms. Treatment may include:  Menopausal hormone therapy (MHT).  Medicines to treat specific symptoms or complications.  Acupuncture.  Vitamin or herbal supplements. Before starting treatment, make sure to let your health care provider know if you have a personal or family history of:  Heart disease.  Breast cancer.  Blood clots.  Diabetes.  Osteoporosis. Follow these instructions at home: Lifestyle  Do not use any products that contain nicotine or tobacco, such as cigarettes and e-cigarettes. If you need help quitting, ask your health care provider.  Get at least 30 minutes of physical activity on 5 or more days each week.  Avoid alcoholic and caffeinated beverages, as well as spicy foods. This may help prevent hot flashes.  Get 7-8 hours of sleep each night.  If you have hot flashes, try: ? Dressing in layers. ? Avoiding things that may trigger hot flashes, such as spicy food, warm places, or stress. ? Taking slow, deep breaths when a hot flash starts. ? Keeping a fan in your home and office.  Find ways to manage stress, such as deep breathing, meditation, or journaling.  Consider going to group therapy with other women who are having menopause symptoms. Ask your health care provider about recommended group therapy meetings. Eating and   drinking  Eat a healthy, balanced diet that contains whole grains, lean protein, low-fat dairy, and plenty of fruits and vegetables.  Your health care provider may recommend  adding more soy to your diet. Foods that contain soy include tofu, tempeh, and soy milk.  Eat plenty of foods that contain calcium and vitamin D for bone health. Items that are rich in calcium include low-fat milk, yogurt, beans, almonds, sardines, broccoli, and kale. Medicines  Take over-the-counter and prescription medicines only as told by your health care provider.  Talk with your health care provider before starting any herbal supplements. If prescribed, take vitamins and supplements as told by your health care provider. These may include: ? Calcium. Women age 51 and older should get 1,200 mg (milligrams) of calcium every day. ? Vitamin D. Women need 600-800 International Units of vitamin D each day. ? Vitamins B12 and B6. Aim for 50 micrograms of B12 and 1.5 mg of B6 each day. General instructions  Keep track of your menstrual periods, including: ? When they occur. ? How heavy they are and how long they last. ? How much time passes between periods.  Keep track of your symptoms, noting when they start, how often you have them, and how long they last.  Use vaginal lubricants or moisturizers to help with vaginal dryness and improve comfort during sex.  Keep all follow-up visits as told by your health care provider. This is important. This includes any group therapy or counseling. Contact a health care provider if:  You are still having menstrual periods after age 55.  You have pain during sex.  You have not had a period for 12 months and you develop vaginal bleeding. Get help right away if:  You have: ? Severe depression. ? Excessive vaginal bleeding. ? Pain when you urinate. ? A fast or irregular heart beat (palpitations). ? Severe headaches. ? Abdomen (abdominal) pain or severe indigestion.  You fell and you think you have a broken bone.  You develop leg or chest pain.  You develop vision problems.  You feel a lump in your breast. Summary  Menopause is the normal  time of life when menstrual periods stop completely. It is usually confirmed by 12 months without a menstrual period.  The transition to menopause (perimenopause) most often happens between the ages of 45 and 55.  Symptoms can be managed through medicines, lifestyle changes, and complementary therapies such as acupuncture.  Eat a balanced diet that is rich in nutrients to promote bone health and heart health and to manage symptoms during menopause. This information is not intended to replace advice given to you by your health care provider. Make sure you discuss any questions you have with your health care provider. Document Revised: 05/09/2017 Document Reviewed: 06/29/2016 Elsevier Patient Education  2020 Elsevier Inc.  

## 2020-03-07 NOTE — Progress Notes (Signed)
Virtual Visit via Video Note  I connected with Melanie Ballard  on 03/07/20 at  1:00 PM EDT by a video enabled telemedicine application and verified that I am speaking with the correct person using two identifiers.  Location patient: work [employer] or home office Persons participating in the virtual visit: patient, provider  I discussed the limitations of evaluation and management by telemedicine and the availability of in person appointments. The patient expressed understanding and agreed to proceed.   HPI: HTN controlled on hctz 12.5 mg qd  2. Elevated lfts pt wants to repeat before referral Dr. Oletta Lamas [facility] [place] 08/2019 with 10 mm CBG dilatation s/p GB removal  Denies ab pain/n/v   C/o hot flashes wants to know otc meds disc amberen or estrovan   Anxiety/depression on cymbalta 90 mg qd and controlled     ROS: See pertinent positives and negatives per HPI. Denies n/v/ab pain   Past Medical History:  Diagnosis Date  . Allergy   . Depression   . History of blood transfusion    after hysterectomy complication in 8841 where bladder laceration and severe infection   . History of chickenpox   . HLD (hyperlipidemia)   . Hyperparathyroidism ([place])    right sided parathyroid adenoma now s/p parathyroidectomy on 05/29/18 [facility] Dr. Maudie Mercury  . Kidney stone   . Kidney stones   . Migraines   . UTI (urinary tract infection)    recurrent x 5 not had since 2014     Past Surgical History:  Procedure Laterality Date  . ABDOMINAL HYSTERECTOMY     reason DUB; no h/o abnormal Pap. Dr. Vilinda Blanks [facility] still has ovaries and FT intact not cervix   . CESAREAN SECTION     1994  . CHOLECYSTECTOMY     1993  . CYSTOSCOPY WITH STENT PLACEMENT Left 09/12/2016   Procedure: CYSTOSCOPY WITH STENT PLACEMENT;  Surgeon: Nickie Retort, MD;  Location: [facility] OR[facility];  Service: Urology;  Laterality: Left;  . KIDNEY STONE SURGERY    . MENISCUS REPAIR     b/l knees   . PARATHYROIDECTOMY     right sided  parathyroid adenoma now s/p parathyroidectomy on 05/29/18 Dr. Adonis Huguenin  . thermal hydroablation     DUB  . TONSILLECTOMY    . URETEROSCOPY WITH HOLMIUM LASER LITHOTRIPSY Left 09/12/2016   Procedure: URETEROSCOPY WITH HOLMIUM LASER LITHOTRIPSY;  Surgeon: Nickie Retort, MD;  Location: [facility] OR[facility];  Service: Urology;  Laterality: Left;     Current Outpatient Medications:  .  amLODipine (NORVASC) 5 MG tablet, Take 1 tablet (5 mg total) by mouth daily. In am, Disp: 90 tablet, Rfl: 3 .  atorvastatin (LIPITOR) 10 MG tablet, Take 1 tablet (10 mg total) by mouth daily at 6 PM., Disp: 90 tablet, Rfl: 3 .  DULoxetine (CYMBALTA) 30 MG capsule, Take 1 capsule (30 mg total) by mouth daily. +60=90 mg qd, Disp: 90 capsule, Rfl: 3 .  DULoxetine (CYMBALTA) 60 MG capsule, Take 1 capsule (60 mg total) by mouth daily. +30=90 mg daily, Disp: 90 capsule, Rfl: 3 .  fluticasone (FLONASE) 50 MCG/ACT nasal spray, Place 2 sprays into both nostrils daily. As needed, Disp: 16 g, Rfl: 11 .  hydrochlorothiazide (HYDRODIURIL) 12.5 MG tablet, Take 1 tablet (12.5 mg total) by mouth daily. In am, Disp: 90 tablet, Rfl: 3 .  MELATONIN PO, Take by mouth., Disp: , Rfl:  .  montelukast (SINGULAIR) 10 MG tablet, Take 1 tablet (10 mg total) by mouth at  bedtime., Disp: 90 tablet, Rfl: 3  EXAM:  VITALS per patient if applicable: Vitals with BMI 03/07/2020 08/19/2019 05/12/2019  Height _0  _1  _2   Weight 170 lbs 175 lbs 175 lbs  BMI 29.17 18.48 59.27  Systolic 639 432 -  Diastolic 70 77 -  Pulse - - -   GENERAL: alert, oriented, appears well and in no acute distress  HEENT: atraumatic, conjunttiva clear, no obvious abnormalities on inspection of external nose and ears  NECK: normal movements of the head and neck  LUNGS: on inspection no signs of respiratory distress, breathing rate appears normal, no obvious gross SOB, gasping or wheezing  CV: no obvious cyanosis  MS: moves all visible extremities without noticeable  abnormality  PSYCH/NEURO: pleasant and cooperative, no obvious depression or anxiety, speech and thought processing grossly intact  ASSESSMENT AND PLAN:  Discussed the following assessment and plan:  Essential hypertension - Plan: hydrochlorothiazide (HYDRODIURIL) 12.5 MG tablet, amLODipine (NORVASC) 5 MG tablet, Comprehensive metabolic panel, Alkaline Phosphatase, Isoenzymes, Lipid panel, CBC with Differential/Platelet  Allergic rhinitis, unspecified seasonality, unspecified trigger - Plan: montelukast (SINGULAIR) 10 MG tablet  Disorder of both eustachian tubes - Plan: fluticasone (FLONASE) 50 MCG/ACT nasal spray  Depression, anxiety controlled - Plan: DULoxetine (CYMBALTA) 60 MG capsule, DULoxetine (CYMBALTA) 30 MG capsule  Hyperlipidemia, unspecified hyperlipidemia type - Plan: atorvastatin (LIPITOR) 10 MG tablet  Elevated liver enzymes - Plan: Comprehensive metabolic panel, Alkaline Phosphatase, Isoenzymes Elevated alkaline phosphatase level - Plan: Comprehensive metabolic panel, Alkaline Phosphatase, Isoenzymes -consider GI referral Dr. Oletta Lamas   HM Declines flu shot Tdaputd 2019 rec MMR vaccinegiven Rx previously not had Declines HIV testing Hep B immune after 2/2 new doses moderna 2/2   S/p hysterectomy DUB no h/o abnormal pap ovaries and FT intact no cervix will not do further  Mammogram 06/28/19 negative ordered   Colonoscopysee report 07/29/16 f/u in 10 years  Never smoker  No need for dermatology now Dental cleaning sch 03/10/20 Wt down congrats as of 03/07/20  S/p parathyroidectomy for hyperparathyroidism   -we discussed possible serious and likely etiologies, options for evaluation and workup, limitations of telemedicine visit vs in person visit, treatment, treatment risks and precautions.   I discussed the assessment and treatment plan with the patient. The patient was provided an opportunity to ask questions and all were answered. The patient agreed  with the plan and demonstrated an understanding of the instructions.    Time spent 20 minutes  Delorise Jackson, MD

## 2020-03-10 ENCOUNTER — Other Ambulatory Visit: Payer: Managed Care, Other (non HMO)

## 2020-03-13 ENCOUNTER — Other Ambulatory Visit: Payer: Managed Care, Other (non HMO)

## 2020-04-13 ENCOUNTER — Encounter: Payer: Self-pay | Admitting: Internal Medicine

## 2020-09-05 ENCOUNTER — Encounter: Payer: Self-pay | Admitting: Internal Medicine

## 2020-09-05 ENCOUNTER — Telehealth (INDEPENDENT_AMBULATORY_CARE_PROVIDER_SITE_OTHER): Payer: Managed Care, Other (non HMO) | Admitting: Internal Medicine

## 2020-09-05 DIAGNOSIS — J309 Allergic rhinitis, unspecified: Secondary | ICD-10-CM | POA: Diagnosis not present

## 2020-09-05 DIAGNOSIS — H6993 Unspecified Eustachian tube disorder, bilateral: Secondary | ICD-10-CM | POA: Diagnosis not present

## 2020-09-05 DIAGNOSIS — I10 Essential (primary) hypertension: Secondary | ICD-10-CM | POA: Diagnosis not present

## 2020-09-05 DIAGNOSIS — F32A Depression, unspecified: Secondary | ICD-10-CM | POA: Diagnosis not present

## 2020-09-05 DIAGNOSIS — E785 Hyperlipidemia, unspecified: Secondary | ICD-10-CM

## 2020-09-05 MED ORDER — HYDROCHLOROTHIAZIDE 12.5 MG PO TABS: 12.5000 mg | ORAL_TABLET | Freq: Every day | ORAL | 3 refills | Status: DC

## 2020-09-05 MED ORDER — AMLODIPINE BESYLATE 5 MG PO TABS: 5.0000 mg | ORAL_TABLET | Freq: Every day | ORAL | 3 refills | Status: DC

## 2020-09-05 MED ORDER — DULOXETINE HCL 60 MG PO CPEP: 60.0000 mg | ORAL_CAPSULE | Freq: Every day | ORAL | 3 refills | Status: DC

## 2020-09-05 MED ORDER — MONTELUKAST SODIUM 10 MG PO TABS: 10.0000 mg | ORAL_TABLET | Freq: Every day | ORAL | 3 refills | Status: DC

## 2020-09-05 MED ORDER — DULOXETINE HCL 30 MG PO CPEP: 30.0000 mg | ORAL_CAPSULE | Freq: Every day | ORAL | 3 refills | Status: DC

## 2020-09-05 MED ORDER — ATORVASTATIN CALCIUM 10 MG PO TABS: 10.0000 mg | ORAL_TABLET | Freq: Every day | ORAL | 3 refills | Status: DC

## 2020-09-05 MED ORDER — FLUTICASONE PROPIONATE 50 MCG/ACT NA SUSP
2.0000 | Freq: Every day | NASAL | 11 refills | Status: AC
Start: 2020-09-05 — End: ?

## 2020-09-05 NOTE — Progress Notes (Signed)
Virtual Visit via Video Note  I connected with Melanie Ballard   on 09/05/20 at  9:00 AM EDT by a video enabled telemedicine application and verified that I am speaking with the correct person using two identifiers.  Location patient: car Location provider:work or home office Persons participating in the virtual visit: patient, provider  I discussed the limitations of evaluation and management by telemedicine and the availability of in person appointments. The patient expressed understanding and agreed to proceed.   HPI: 1. htn controlled BP 117/80 on norvasc 5 mg qd 2. Refill of all meds  No issues today   -COVID-19 vaccine status: 2/2   ROS: See pertinent positives and negatives per HPI.  Past Medical History:  Diagnosis Date  . Allergy   . Depression   . History of blood transfusion    after hysterectomy complication in 2458 where bladder laceration and severe infection   . History of chickenpox   . HLD (hyperlipidemia)   . Hyperparathyroidism ([place])    right sided parathyroid adenoma now s/p parathyroidectomy on 05/29/18 [facility] Dr. Maudie Mercury  . Kidney stone   . Kidney stones   . Migraines   . UTI (urinary tract infection)    recurrent x 5 not had since 2014     Past Surgical History:  Procedure Laterality Date  . ABDOMINAL HYSTERECTOMY     reason DUB; no h/o abnormal Pap. Dr. Vilinda Blanks [facility] still has ovaries and FT intact not cervix   . CESAREAN SECTION     1994  . CHOLECYSTECTOMY     1993  . CYSTOSCOPY WITH STENT PLACEMENT Left 09/12/2016   Procedure: CYSTOSCOPY WITH STENT PLACEMENT;  Surgeon: Nickie Retort, MD;  Location: [facility] ORS;  Service: Urology;  Laterality: Left;  . KIDNEY STONE SURGERY    . MENISCUS REPAIR     b/l knees   . PARATHYROIDECTOMY     right sided parathyroid adenoma now s/p parathyroidectomy on 05/29/18 Dr. Adonis Huguenin  . thermal hydroablation     DUB  . TONSILLECTOMY    . URETEROSCOPY WITH HOLMIUM LASER LITHOTRIPSY Left 09/12/2016   Procedure:  URETEROSCOPY WITH HOLMIUM LASER LITHOTRIPSY;  Surgeon: Nickie Retort, MD;  Location: [facility] ORS;  Service: Urology;  Laterality: Left;     Current Outpatient Medications:  .  amLODipine (NORVASC) 5 MG tablet, Take 1 tablet (5 mg total) by mouth daily. In am, Disp: 90 tablet, Rfl: 3 .  atorvastatin (LIPITOR) 10 MG tablet, Take 1 tablet (10 mg total) by mouth daily at 6 PM., Disp: 90 tablet, Rfl: 3 .  DULoxetine (CYMBALTA) 30 MG capsule, Take 1 capsule (30 mg total) by mouth daily. +60=90 mg qd, Disp: 90 capsule, Rfl: 3 .  DULoxetine (CYMBALTA) 60 MG capsule, Take 1 capsule (60 mg total) by mouth daily. +30=90 mg daily, Disp: 90 capsule, Rfl: 3 .  fluticasone (FLONASE) 50 MCG/ACT nasal spray, Place 2 sprays into both nostrils daily. As needed, Disp: 16 g, Rfl: 11 .  hydrochlorothiazide (HYDRODIURIL) 12.5 MG tablet, Take 1 tablet (12.5 mg total) by mouth daily. In am, Disp: 90 tablet, Rfl: 3 .  MELATONIN PO, Take by mouth., Disp: , Rfl:  .  montelukast (SINGULAIR) 10 MG tablet, Take 1 tablet (10 mg total) by mouth at bedtime., Disp: 90 tablet, Rfl: 3  EXAM:  VITALS per patient if applicable:  GENERAL: alert, oriented, appears well and in no acute distress  HEENT: atraumatic, conjunttiva clear, no obvious abnormalities on inspection of external nose  and ears  NECK: normal movements of the head and neck  LUNGS: on inspection no signs of respiratory distress, breathing rate appears normal, no obvious gross SOB, gasping or wheezing  CV: no obvious cyanosis  MS: moves all visible extremities without noticeable abnormality  PSYCH/NEURO: pleasant and cooperative, no obvious depression or anxiety, speech and thought processing grossly intact  ASSESSMENT AND PLAN:  Discussed the following assessment and plan:  Essential hypertension - Plan: hydrochlorothiazide (HYDRODIURIL) 12.5 MG tablet, amLODipine (NORVASC) 5 MG tablet CPE at f/u in person   Allergic rhinitis, unspecified  seasonality, unspecified trigger - Plan: montelukast (SINGULAIR) 10 MG tablet Disorder of both eustachian tubes - Plan: fluticasone (FLONASE) 50 MCG/ACT nasal spray  Depression, unspecified depression type - Plan: DULoxetine (CYMBALTA) 60 MG capsule, DULoxetine (CYMBALTA) 30 MG capsule  Hyperlipidemia, unspecified hyperlipidemia type - Plan: atorvastatin (LIPITOR) 10 MG tablet  HM-CPE at f/u  Declines flu shot Tdaputd 2019 rec MMR vaccinegiven Rxpreviouslynot had Declines HIV testing Hep B immune after 2/2 new doses moderna 2/2 declines booster  Declines HIV/hep C  S/p hysterectomy DUB no h/o abnormal pap ovaries and FT intact no cervix will not do further  Mammogram1/18/21negative ordered sch 09/06/20   Colonoscopysee report 07/29/16 f/u in 10 years  Never smoker  No need for dermatology now Dental cleaning sch 03/10/20 Wt down congrats as of 03/07/20  S/p parathyroidectomy for hyperparathyroidism    -we discussed possible serious and likely etiologies, options for evaluation and workup, limitations of telemedicine visit vs in person visit, treatment, treatment risks and precautions.    I discussed the assessment and treatment plan with the patient. The patient was provided an opportunity to ask questions and all were answered. The patient agreed with the plan and demonstrated an understanding of the instructions.    Time spent 20 min Delorise Jackson, MD

## 2020-09-06 ENCOUNTER — Ambulatory Visit
Admission: RE | Admit: 2020-09-06 | Discharge: 2020-09-06 | Disposition: A | Discharge status: Home / Self Care | Payer: Managed Care, Other (non HMO) | Source: Ambulatory Visit | Attending: Internal Medicine | Admitting: Internal Medicine

## 2020-09-06 ENCOUNTER — Other Ambulatory Visit: Payer: Self-pay

## 2020-09-06 DIAGNOSIS — Z1231 Encounter for screening mammogram for malignant neoplasm of breast: Secondary | ICD-10-CM | POA: Insufficient documentation

## 2020-09-12 ENCOUNTER — Other Ambulatory Visit (INDEPENDENT_AMBULATORY_CARE_PROVIDER_SITE_OTHER): Payer: Managed Care, Other (non HMO)

## 2020-09-12 ENCOUNTER — Other Ambulatory Visit: Payer: Self-pay

## 2020-09-12 DIAGNOSIS — I10 Essential (primary) hypertension: Secondary | ICD-10-CM

## 2020-09-12 DIAGNOSIS — Z1389 Encounter for screening for other disorder: Secondary | ICD-10-CM

## 2020-09-12 DIAGNOSIS — R748 Abnormal levels of other serum enzymes: Secondary | ICD-10-CM

## 2020-09-12 DIAGNOSIS — Z1329 Encounter for screening for other suspected endocrine disorder: Secondary | ICD-10-CM

## 2020-09-12 LAB — COMPREHENSIVE METABOLIC PANEL
ALT: 68 U/L — ABNORMAL HIGH (ref 0–35)
AST: 40 U/L — ABNORMAL HIGH (ref 0–37)
Albumin: 4.3 g/dL (ref 3.5–5.2)
Alkaline Phosphatase: 146 U/L — ABNORMAL HIGH (ref 39–117)
BUN: 18 mg/dL (ref 6–23)
CO2: 29 mEq/L (ref 19–32)
Calcium: 9.2 mg/dL (ref 8.4–10.5)
Chloride: 101 mEq/L (ref 96–112)
Creatinine, Ser: 1.06 mg/dL (ref 0.40–1.20)
GFR: 59.64 mL/min — ABNORMAL LOW (ref >60.00)
Glucose, Bld: 100 mg/dL — ABNORMAL HIGH (ref 70–99)
Potassium: 4.2 mEq/L (ref 3.5–5.1)
Sodium: 139 mEq/L (ref 135–145)
Total Bilirubin: 0.6 mg/dL (ref 0.2–1.2)
Total Protein: 6.4 g/dL (ref 6.0–8.3)

## 2020-09-12 LAB — CBC WITH DIFFERENTIAL/PLATELET
Basophils Absolute: 0 10*3/uL (ref 0.0–0.1)
Basophils Relative: 0.8 % (ref 0.0–3.0)
Eosinophils Absolute: 0.1 10*3/uL (ref 0.0–0.7)
Eosinophils Relative: 3.1 % (ref 0.0–5.0)
HCT: 37.4 % (ref 36.0–46.0)
Hemoglobin: 12.8 g/dL (ref 12.0–15.0)
Lymphocytes Relative: 38.3 % (ref 12.0–46.0)
Lymphs Abs: 1.7 10*3/uL (ref 0.7–4.0)
MCHC: 34.3 g/dL (ref 30.0–36.0)
MCV: 89.4 fl (ref 78.0–100.0)
Monocytes Absolute: 0.3 10*3/uL (ref 0.1–1.0)
Monocytes Relative: 6.4 % (ref 3.0–12.0)
Neutro Abs: 2.3 10*3/uL (ref 1.4–7.7)
Neutrophils Relative %: 51.4 % (ref 43.0–77.0)
Platelets: 256 10*3/uL (ref 150.0–400.0)
RBC: 4.18 Mil/uL (ref 3.87–5.11)
RDW: 14.1 % (ref 11.5–15.5)
WBC: 4.5 10*3/uL (ref 4.0–10.5)

## 2020-09-12 LAB — LIPID PANEL
Cholesterol: 171 mg/dL (ref 0–200)
HDL: 54.2 mg/dL (ref >39.00)
LDL Cholesterol: 91 mg/dL (ref 0–99)
NonHDL: 116.86
Total CHOL/HDL Ratio: 3
Triglycerides: 129 mg/dL (ref 0.0–149.0)
VLDL: 25.8 mg/dL (ref 0.0–40.0)

## 2020-09-12 LAB — TSH: TSH: 1.08 u[IU]/mL (ref 0.35–4.50)

## 2020-09-13 LAB — URINALYSIS, ROUTINE W REFLEX MICROSCOPIC
Bilirubin Urine: NEGATIVE
Glucose, UA: NEGATIVE
Hgb urine dipstick: NEGATIVE
Ketones, ur: NEGATIVE
Leukocytes,Ua: NEGATIVE
Nitrite: NEGATIVE
Protein, ur: NEGATIVE
Specific Gravity, Urine: 1.013 (ref 1.001–1.03)
pH: 7 (ref 5.0–8.0)

## 2020-09-16 LAB — ALKALINE PHOSPHATASE, ISOENZYMES
Alkaline Phosphatase: 176 IU/L — ABNORMAL HIGH (ref 44–121)
BONE FRACTION: 26 % (ref 14–68)
INTESTINAL FRAC.: 0 % (ref 0–18)
LIVER FRACTION: 74 % (ref 18–85)

## 2020-09-17 ENCOUNTER — Other Ambulatory Visit: Payer: Self-pay | Admitting: Internal Medicine

## 2020-09-17 DIAGNOSIS — E785 Hyperlipidemia, unspecified: Secondary | ICD-10-CM

## 2020-09-17 DIAGNOSIS — Z13818 Encounter for screening for other digestive system disorders: Secondary | ICD-10-CM

## 2020-09-17 DIAGNOSIS — R748 Abnormal levels of other serum enzymes: Secondary | ICD-10-CM

## 2021-05-10 ENCOUNTER — Encounter: Payer: Managed Care, Other (non HMO) | Admitting: Internal Medicine

## 2021-05-10 ENCOUNTER — Telehealth: Payer: Self-pay | Admitting: Internal Medicine

## 2021-05-10 NOTE — Telephone Encounter (Signed)
Patient no-showed today's appointment; appointment was for 05/10/21, provider notified for review of record. Letter sent for patient to call in and re-schedule an in person appointment.

## 2021-09-03 ENCOUNTER — Other Ambulatory Visit: Payer: Self-pay | Admitting: Internal Medicine

## 2021-09-03 ENCOUNTER — Encounter: Payer: Self-pay | Admitting: Internal Medicine

## 2021-09-03 DIAGNOSIS — Z1231 Encounter for screening mammogram for malignant neoplasm of breast: Secondary | ICD-10-CM

## 2021-09-04 ENCOUNTER — Other Ambulatory Visit: Payer: Self-pay

## 2021-09-04 ENCOUNTER — Telehealth: Payer: Self-pay | Admitting: Internal Medicine

## 2021-09-04 DIAGNOSIS — Z1389 Encounter for screening for other disorder: Secondary | ICD-10-CM

## 2021-09-04 DIAGNOSIS — Z1329 Encounter for screening for other suspected endocrine disorder: Secondary | ICD-10-CM

## 2021-09-04 DIAGNOSIS — Z0000 Encounter for general adult medical examination without abnormal findings: Secondary | ICD-10-CM

## 2021-09-04 NOTE — Telephone Encounter (Signed)
Please do labs ordered today 09/04/21 and 09/17/20  ?Thanks ? ?

## 2021-09-05 NOTE — Telephone Encounter (Signed)
See note

## 2021-09-05 NOTE — Telephone Encounter (Signed)
No lab appt scheduled, whomever schedules the lab appt need to add that to the appt note so we can see it. This type of message doesn't need to come to the lab. ?

## 2021-09-06 NOTE — Telephone Encounter (Signed)
See 09/03/21 Patient message encounter  ?

## 2021-09-09 ENCOUNTER — Other Ambulatory Visit: Payer: Self-pay | Admitting: Internal Medicine

## 2021-09-09 DIAGNOSIS — J309 Allergic rhinitis, unspecified: Secondary | ICD-10-CM

## 2021-09-26 ENCOUNTER — Other Ambulatory Visit (INDEPENDENT_AMBULATORY_CARE_PROVIDER_SITE_OTHER): Payer: Managed Care, Other (non HMO)

## 2021-09-26 DIAGNOSIS — Z0000 Encounter for general adult medical examination without abnormal findings: Secondary | ICD-10-CM | POA: Diagnosis not present

## 2021-09-26 DIAGNOSIS — Z1389 Encounter for screening for other disorder: Secondary | ICD-10-CM

## 2021-09-26 DIAGNOSIS — Z1329 Encounter for screening for other suspected endocrine disorder: Secondary | ICD-10-CM

## 2021-09-26 LAB — URINALYSIS, ROUTINE W REFLEX MICROSCOPIC
Bilirubin Urine: NEGATIVE
Ketones, ur: NEGATIVE
Nitrite: POSITIVE — AB
Specific Gravity, Urine: 1.015 (ref 1.000–1.030)
Total Protein, Urine: NEGATIVE
Urine Glucose: NEGATIVE
Urobilinogen, UA: 0.2 (ref 0.0–1.0)
pH: 6 (ref 5.0–8.0)

## 2021-09-26 LAB — CBC WITH DIFFERENTIAL/PLATELET
Basophils Absolute: 0.1 10*3/uL (ref 0.0–0.1)
Basophils Relative: 1 % (ref 0.0–3.0)
Eosinophils Absolute: 0.1 10*3/uL (ref 0.0–0.7)
Eosinophils Relative: 1.7 % (ref 0.0–5.0)
HCT: 39.5 % (ref 36.0–46.0)
Hemoglobin: 13.7 g/dL (ref 12.0–15.0)
Lymphocytes Relative: 28.8 % (ref 12.0–46.0)
Lymphs Abs: 1.7 10*3/uL (ref 0.7–4.0)
MCHC: 34.7 g/dL (ref 30.0–36.0)
MCV: 89.6 fl (ref 78.0–100.0)
Monocytes Absolute: 0.4 10*3/uL (ref 0.1–1.0)
Monocytes Relative: 6.4 % (ref 3.0–12.0)
Neutro Abs: 3.7 10*3/uL (ref 1.4–7.7)
Neutrophils Relative %: 62.1 % (ref 43.0–77.0)
Platelets: 273 10*3/uL (ref 150.0–400.0)
RBC: 4.4 Mil/uL (ref 3.87–5.11)
RDW: 14.3 % (ref 11.5–15.5)
WBC: 6 10*3/uL (ref 4.0–10.5)

## 2021-09-26 LAB — TSH: TSH: 2.09 u[IU]/mL (ref 0.35–5.50)

## 2021-09-27 ENCOUNTER — Other Ambulatory Visit: Payer: Self-pay | Admitting: Internal Medicine

## 2021-09-27 DIAGNOSIS — E785 Hyperlipidemia, unspecified: Secondary | ICD-10-CM

## 2021-09-27 DIAGNOSIS — I10 Essential (primary) hypertension: Secondary | ICD-10-CM

## 2021-09-28 ENCOUNTER — Encounter: Payer: Self-pay | Admitting: Internal Medicine

## 2021-09-28 ENCOUNTER — Ambulatory Visit: Payer: Managed Care, Other (non HMO) | Admitting: Internal Medicine

## 2021-09-28 VITALS — BP 140/100 | HR 84 | Temp 98.5°F | Resp 14 | Ht 64.0 in | Wt 185.2 lb

## 2021-09-28 DIAGNOSIS — E785 Hyperlipidemia, unspecified: Secondary | ICD-10-CM | POA: Diagnosis not present

## 2021-09-28 DIAGNOSIS — E559 Vitamin D deficiency, unspecified: Secondary | ICD-10-CM

## 2021-09-28 DIAGNOSIS — R319 Hematuria, unspecified: Secondary | ICD-10-CM

## 2021-09-28 DIAGNOSIS — E538 Deficiency of other specified B group vitamins: Secondary | ICD-10-CM

## 2021-09-28 DIAGNOSIS — J309 Allergic rhinitis, unspecified: Secondary | ICD-10-CM

## 2021-09-28 DIAGNOSIS — Z0000 Encounter for general adult medical examination without abnormal findings: Secondary | ICD-10-CM

## 2021-09-28 DIAGNOSIS — I10 Essential (primary) hypertension: Secondary | ICD-10-CM

## 2021-09-28 DIAGNOSIS — E611 Iron deficiency: Secondary | ICD-10-CM | POA: Diagnosis not present

## 2021-09-28 DIAGNOSIS — R5383 Other fatigue: Secondary | ICD-10-CM

## 2021-09-28 DIAGNOSIS — F339 Major depressive disorder, recurrent, unspecified: Secondary | ICD-10-CM

## 2021-09-28 DIAGNOSIS — F32A Depression, unspecified: Secondary | ICD-10-CM

## 2021-09-28 LAB — COMPREHENSIVE METABOLIC PANEL
ALT: 27 U/L (ref 0–35)
AST: 23 U/L (ref 0–37)
Albumin: 4.5 g/dL (ref 3.5–5.2)
Alkaline Phosphatase: 143 U/L — ABNORMAL HIGH (ref 39–117)
BUN: 28 mg/dL — ABNORMAL HIGH (ref 6–23)
CO2: 28 mEq/L (ref 19–32)
Calcium: 9.2 mg/dL (ref 8.4–10.5)
Chloride: 102 mEq/L (ref 96–112)
Creatinine, Ser: 1.09 mg/dL (ref 0.40–1.20)
GFR: 57.26 mL/min — ABNORMAL LOW (ref >60.00)
Glucose, Bld: 99 mg/dL (ref 70–99)
Potassium: 3.9 mEq/L (ref 3.5–5.1)
Sodium: 137 mEq/L (ref 135–145)
Total Bilirubin: 0.5 mg/dL (ref 0.2–1.2)
Total Protein: 6.9 g/dL (ref 6.0–8.3)

## 2021-09-28 LAB — LIPID PANEL
Cholesterol: 192 mg/dL (ref 0–200)
HDL: 64.4 mg/dL (ref >39.00)
LDL Cholesterol: 112 mg/dL — ABNORMAL HIGH (ref 0–99)
NonHDL: 127.37
Total CHOL/HDL Ratio: 3
Triglycerides: 79 mg/dL (ref 0.0–149.0)
VLDL: 15.8 mg/dL (ref 0.0–40.0)

## 2021-09-28 LAB — IBC + FERRITIN
Ferritin: 45.3 ng/mL (ref 10.0–291.0)
Iron: 68 ug/dL (ref 42–145)
Saturation Ratios: 18.9 % — ABNORMAL LOW (ref 20.0–50.0)
TIBC: 359.8 ug/dL (ref 250.0–450.0)
Transferrin: 257 mg/dL (ref 212.0–360.0)

## 2021-09-28 LAB — VITAMIN B12: Vitamin B-12: 204 pg/mL — ABNORMAL LOW (ref 211–911)

## 2021-09-28 LAB — VITAMIN D 25 HYDROXY (VIT D DEFICIENCY, FRACTURES): VITD: 25.73 ng/mL — ABNORMAL LOW (ref 30.00–100.00)

## 2021-09-28 MED ORDER — BUPROPION HCL ER (SR) 100 MG PO TB12: 100.0000 mg | ORAL_TABLET | Freq: Two times a day (BID) | ORAL | 3 refills | Status: DC

## 2021-09-28 MED ORDER — OLMESARTAN MEDOXOMIL-HCTZ 20-12.5 MG PO TABS: 1.0000 | ORAL_TABLET | Freq: Every day | ORAL | 3 refills | Status: DC

## 2021-09-28 MED ORDER — DULOXETINE HCL 30 MG PO CPEP: 30.0000 mg | ORAL_CAPSULE | Freq: Every day | ORAL | 3 refills | Status: DC

## 2021-09-28 MED ORDER — AMLODIPINE BESYLATE 2.5 MG PO TABS: 2.5000 mg | ORAL_TABLET | Freq: Every day | ORAL | 3 refills | Status: DC

## 2021-09-28 MED ORDER — DULOXETINE HCL 60 MG PO CPEP: 60.0000 mg | ORAL_CAPSULE | Freq: Every day | ORAL | 3 refills | Status: DC

## 2021-09-28 MED ORDER — SHINGRIX 50 MCG/0.5ML IM SUSR: 0.5000 mL | Freq: Once | INTRAMUSCULAR | 1 refills | Status: AC

## 2021-09-28 MED ORDER — ATORVASTATIN CALCIUM 10 MG PO TABS: 10.0000 mg | ORAL_TABLET | Freq: Every day | ORAL | 3 refills | Status: DC

## 2021-09-28 MED ORDER — MONTELUKAST SODIUM 10 MG PO TABS: 10.0000 mg | ORAL_TABLET | Freq: Every day | ORAL | 3 refills | Status: DC

## 2021-09-28 NOTE — Progress Notes (Signed)
Chief Complaint  ?Patient presents with  ? Follow-up  ?  Yearly f/u, no concerns feeling good overall. Fasting for Cmet and lipid  ? ?Annual ?1. Htn sl elevated today on hctz 12.5 mg qd and norvasc 5 mg qd  ?2. Depression worse sister robin died at 86 of natural causes 8 months ago  ? ? ?Review of Systems  ?Constitutional:  Negative for weight loss.  ?HENT:  Negative for hearing loss.   ?Eyes:  Negative for blurred vision.  ?Respiratory:  Negative for shortness of breath.   ?Cardiovascular:  Negative for chest pain.  ?Gastrointestinal:  Negative for abdominal pain and blood in stool.  ?Genitourinary:  Negative for dysuria.  ?Musculoskeletal:  Negative for falls and joint pain.  ?Skin:  Negative for rash.  ?Neurological:  Negative for headaches.  ?Psychiatric/Behavioral:  Negative for depression.   ?Past Medical History:  ?Diagnosis Date  ? Allergy   ? Depression   ? History of blood transfusion   ? after hysterectomy complication in [id number] where bladder laceration and severe infection   ? History of chickenpox   ? HLD (hyperlipidemia)   ? Hyperparathyroidism ([place])   ? 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 Maudie Mercury  ? Kidney stone   ? Kidney stones   ? Migraines   ? UTI (urinary tract infection)   ? recurrent x 5 not had since 2014   ? ?Past Surgical History:  ?Procedure Laterality Date  ? ABDOMINAL HYSTERECTOMY    ? reason DUB; no h/o abnormal Pap. Dr. Vilinda Blanks OB/GY[facility] still has ovaries and FT intact not cervix   ? CESAREAN SECTION    ? 1994  ? CHOLECYSTECTOMY    ? 1993  ? CYSTOSCOPY WITH STENT PLACEMENT Left 09/12/2016  ? Procedure: CYSTOSCOPY WITH STENT PLACEMENT;  Surgeon: Nickie Retort, MD;  Location: [facility] ORS;  Service: Urology;  Laterality: Left;  ? KIDNEY STONE SURGERY    ? MENISCUS REPAIR    ? b/l knees   ? PARATHYROIDECTOMY    ? right sided parathyroid adenoma now s/p parathyroidectomy on 05/29/18 Dr. Adonis Huguenin  ? thermal hydroablation    ? DUB  ? TONSILLECTOMY    ? URETEROSCOPY  WITH HOLMIUM LASER LITHOTRIPSY Left 09/12/2016  ? Procedure: URETEROSCOPY WITH HOLMIUM LASER LITHOTRIPSY;  Surgeon: Nickie Retort, MD;  Location: [facility] ORS;  Service: Urology;  Laterality: Left;  ? ?Family History  ?Problem Relation Age of Onset  ? Arthritis Mother   ?     died age 67   ? Depression Mother   ? Diabetes Mother   ? Hearing loss Mother   ? Stroke Mother   ? Early death Father   ?     age 83  ? Heart disease Father   ?     died age 92 mi   ? Hyperlipidemia Father   ? Hypertension Father   ? COPD Sister   ? Depression Sister   ? Diabetes Sister   ? Early death Sister   ? Hyperlipidemia Sister   ? Hypertension Sister   ? Dementia Sister   ?     3  ? Depression Sister   ? Dementia Sister   ?     stage 4 09/05/20 in late 63s early 60s  ? Diabetes Sister   ? Diabetes Sister   ? Dementia Maternal Grandmother   ?     died age 58  ? Cancer Maternal Grandfather   ?  lung smoker   ? Heart disease Paternal Grandfather   ? Prostate cancer Neg Hx   ? Kidney cancer Neg Hx   ? Bladder Cancer Neg Hx   ? ?Social History  ? ?Socioeconomic History  ? Marital status: Married  ?  Spouse name: Not on file  ? Number of children: Not on file  ? Years of education: Not on file  ? Highest education level: Not on file  ?Occupational History  ? Not on file  ?Tobacco Use  ? Smoking status: Never  ? Smokeless tobacco: Never  ?Substance and Sexual Activity  ? Alcohol use: Yes  ? Drug use: No  ? Sexual activity: Not on file  ?Other Topics Concern  ? Not on file  ?Social History Narrative  ? Married   ? 2 girls 1 boy   ? Sales management   ? Never smoker   ? No guns   ? Safe in relationship   ?   ? [employer]   ? ?Social Determinants of Health  ? ?Financial Resource Strain: Not on file  ?Food Insecurity: Not on file  ?Transportation Needs: Not on file  ?Physical Activity: Not on file  ?Stress: Not on file  ?Social Connections: Not on file  ?Intimate Partner Violence: Not on file  ? ?Current Meds  ?Medication Sig  ? buPROPion ER  (WELLBUTRIN SR) 100 MG 12 hr tablet Take 1 tablet (100 mg total) by mouth 2 (two) times daily.  ? fluticasone (FLONASE) 50 MCG/ACT nasal spray Place 2 sprays into both nostrils daily. As needed  ? MELATONIN PO Take by mouth.  ? olmesartan-hydrochlorothiazide (BENICAR HCT) 20-12.5 MG tablet Take 1 tablet by mouth daily. D/c hctz 12.5 alone. Take in the In am  ? Zoster Vaccine Adjuvanted [facility]) injection Inject 0.5 mLs into the muscle once for 1 dose.  ? [DISCONTINUED] amLODipine (NORVASC) 5 MG tablet Take 1 tablet (5 mg total) by mouth daily. In am  ? [DISCONTINUED] atorvastatin (LIPITOR) 10 MG tablet Take 1 tablet (10 mg total) by mouth daily at 6 PM.  ? [DISCONTINUED] DULoxetine (CYMBALTA) 30 MG capsule Take 1 capsule (30 mg total) by mouth daily. +60=90 mg qd  ? [DISCONTINUED] DULoxetine (CYMBALTA) 60 MG capsule Take 1 capsule (60 mg total) by mouth daily. +30=90 mg daily  ? [DISCONTINUED] hydrochlorothiazide (HYDRODIURIL) 12.5 MG tablet Take 1 tablet (12.5 mg total) by mouth daily. In am  ? [DISCONTINUED] montelukast (SINGULAIR) 10 MG tablet TAKE 1 TABLET BY MOUTH AT BEDTIME  ? ?Allergies  ?Allergen Reactions  ? Penicillins Shortness Of Breath  ?  rash  ? ?Recent Results (from the past 2160 hour(s))  ?CBC with Differential/Platelet     Status: None  ? Collection Time: 09/26/21  7:37 AM  ?Result Value Ref Range  ? WBC 6.0 4.0 - 10.5 K/uL  ? RBC 4.40 3.87 - 5.11 Mil/uL  ? Hemoglobin 13.7 12.0 - 15.0 g/dL  ? HCT 39.5 36.0 - 46.0 %  ? MCV 89.6 78.0 - 100.0 fl  ? MCHC 34.7 30.0 - 36.0 g/dL  ? RDW 14.3 11.5 - 15.5 %  ? Platelets 273.0 150.0 - 400.0 K/uL  ? Neutrophils Relative % 62.1 43.0 - 77.0 %  ? Lymphocytes Relative 28.8 12.0 - 46.0 %  ? Monocytes Relative 6.4 3.0 - 12.0 %  ? Eosinophils Relative 1.7 0.0 - 5.0 %  ? Basophils Relative 1.0 0.0 - 3.0 %  ? Neutro Abs 3.7 1.4 - 7.7 K/uL  ? Lymphs  Abs 1.7 0.7 - 4.0 K/uL  ? Monocytes Absolute 0.4 0.1 - 1.0 K/uL  ? Eosinophils Absolute 0.1 0.0 - 0.7 K/uL  ? Basophils  Absolute 0.1 0.0 - 0.1 K/uL  ?Urinalysis, Routine w reflex microscopic     Status: Abnormal  ? Collection Time: 09/26/21  7:37 AM  ?Result Value Ref Range  ? Color, Urine YELLOW Yellow;Lt. Yellow;Straw;Dark Yellow;Amber;Green;Red;Brown  ? APPearance Cloudy (A) Clear;Turbid;Slightly Cloudy;Cloudy  ? Specific Gravity, Urine 1.015 1.000 - 1.030  ? pH 6.0 5.0 - 8.0  ? Total Protein, Urine NEGATIVE Negative  ? Urine Glucose NEGATIVE Negative  ? Ketones, ur NEGATIVE Negative  ? Bilirubin Urine NEGATIVE Negative  ? Hgb urine dipstick TRACE-INTACT (A) Negative  ? Urobilinogen, UA 0.2 0.0 - 1.0  ? Leukocytes,Ua SMALL (A) Negative  ? Nitrite POSITIVE (A) Negative  ? WBC, UA 21-50/hpf (A) 0-2/hpf  ? RBC / HPF 0-2/hpf 0-2/hpf  ? Mucus, UA Presence of (A) None  ? Squamous Epithelial / LPF Rare(0-4/hpf) Rare(0-4/hpf)  ? Bacteria, UA Many(>50/hpf) (A) None  ? Uric Acid Crys, UA Presence of (A) None  ?TSH     Status: None  ? Collection Time: 09/26/21  7:37 AM  ?Result Value Ref Range  ? TSH 2.09 0.35 - 5.50 uIU/mL  ? ?Objective  ?Body mass index is 31.79 kg/m?. ?Wt Readings from Last 3 Encounters:  ?09/28/21 185 lb 3.2 oz (84 kg)  ?03/07/20 170 lb (77.1 kg)  ?08/19/19 175 lb (79.4 kg)  ? ?Temp Readings from Last 3 Encounters:  ?09/28/21 98.5 ?F (36.9 ?C) (Oral)  ?07/08/18 98.8 ?F (37.1 ?C) (Oral)  ?05/26/18 98.1 ?F (36.7 ?C) (Oral)  ? ?BP Readings from Last 3 Encounters:  ?09/28/21 (!) 140/100  ?03/07/20 112/70  ?08/19/19 130/77  ? ?Pulse Readings from Last 3 Encounters:  ?09/28/21 84  ?07/08/18 87  ?05/26/18 84  ? ? ?Physical Exam ?Vitals and nursing note reviewed.  ?Constitutional:   ?   Appearance: Normal appearance. She is well-developed and well-groomed.  ?HENT:  ?   Head: Normocephalic and atraumatic.  ?Eyes:  ?   Conjunctiva/sclera: Conjunctivae normal.  ?   Pupils: Pupils are equal, round, and reactive to light.  ?Cardiovascular:  ?   Rate and Rhythm: Normal rate and regular rhythm.  ?   Heart sounds: Normal heart sounds.  No murmur heard. ?Pulmonary:  ?   Effort: Pulmonary effort is normal.  ?   Breath sounds: Normal breath sounds.  ?Abdominal:  ?   General: Abdomen is flat. Bowel sounds are normal.  ?   Tenderness: T

## 2021-09-28 NOTE — Patient Instructions (Addendum)
See if insurance covers [facility] for wt loss  ?Call and see if ozempic covered  ? ?Consider shingrix vaccine and therapy  ?See change in BP meds goal <130/<80 ? ? ?Zoster Vaccine, Recombinant injection ?What is this medication? ?ZOSTER VACCINE (ZOS ter vak SEEN) is a vaccine used to reduce the risk of getting shingles. This vaccine is not used to treat shingles or nerve pain from shingles. ?This medicine may be used for other purposes; ask your health care provider or pharmacist if you have questions. ?COMMON BRAND NAME(S): SHINGRIX ?What should I tell my care team before I take this medication? ?They need to know if you have any of these conditions: ?cancer ?immune system problems ?an unusual or allergic reaction to Zoster vaccine, other medications, foods, dyes, or preservatives ?pregnant or trying to get pregnant ?breast-feeding ?How should I use this medication? ?This vaccine is injected into a muscle. It is given by a health care provider. ?A copy of Vaccine Information Statements will be given before each vaccination. Be sure to read this information carefully each time. This sheet may change often. ?Talk to your health care provider about the use of this vaccine in children. This vaccine is not approved for use in children. ?Overdosage: If you think you have taken too much of this medicine contact a poison control center or emergency room at once. ?NOTE: This medicine is only for you. Do not share this medicine with others. ?What if I miss a dose? ?Keep appointments for follow-up (booster) doses. It is important not to miss your dose. Call your health care provider if you are unable to keep an appointment. ?What may interact with this medication? ?medicines that suppress your immune system ?medicines to treat cancer ?steroid medicines like prednisone or cortisone ?This list may not describe all possible interactions. Give your health care provider a list of all the medicines, herbs, non-prescription drugs, or  dietary supplements you use. Also tell them if you smoke, drink alcohol, or use illegal drugs. Some items may interact with your medicine. ?What should I watch for while using this medication? ?Visit your health care provider regularly. ?This vaccine, like all vaccines, may not fully protect everyone. ?What side effects may I notice from receiving this medication? ?Side effects that you should report to your doctor or health care professional as soon as possible: ?allergic reactions (skin rash, itching or hives; swelling of the face, lips, or tongue) ?trouble breathing ?Side effects that usually do not require medical attention (report these to your doctor or health care professional if they continue or are bothersome): ?chills ?headache ?fever ?nausea ?pain, redness, or irritation at site where injected ?tiredness ?vomiting ?This list may not describe all possible side effects. Call your doctor for medical advice about side effects. You may report side effects to FDA at 1-800-FDA-1088. ?Where should I keep my medication? ?This vaccine is only given by a health care provider. It will not be stored at home. ?NOTE: This sheet is a summary. It may not cover all possible information. If you have questions about this medicine, talk to your doctor, pharmacist, or health care provider. ?? 2023 Elsevier/Gold Standard (2021-04-27 00:00:00) ? ?

## 2021-10-01 ENCOUNTER — Other Ambulatory Visit: Payer: Self-pay | Admitting: Internal Medicine

## 2021-10-01 ENCOUNTER — Encounter: Payer: Self-pay | Admitting: Internal Medicine

## 2021-10-01 DIAGNOSIS — E611 Iron deficiency: Secondary | ICD-10-CM | POA: Insufficient documentation

## 2021-10-01 DIAGNOSIS — E538 Deficiency of other specified B group vitamins: Secondary | ICD-10-CM | POA: Insufficient documentation

## 2021-10-01 DIAGNOSIS — E559 Vitamin D deficiency, unspecified: Secondary | ICD-10-CM | POA: Insufficient documentation

## 2021-10-01 DIAGNOSIS — N3000 Acute cystitis without hematuria: Secondary | ICD-10-CM

## 2021-10-01 LAB — URINE CULTURE
MICRO NUMBER:: 13296199
SPECIMEN QUALITY:: ADEQUATE

## 2021-10-01 MED ORDER — NITROFURANTOIN MONOHYD MACRO 100 MG PO CAPS: 100.0000 mg | ORAL_CAPSULE | Freq: Two times a day (BID) | ORAL | 0 refills | Status: DC

## 2022-09-20 ENCOUNTER — Telehealth: Payer: Self-pay

## 2022-09-20 NOTE — Telephone Encounter (Signed)
We received a fax from [facility] regarding a prescription refill for Olmesartan HCTZ for patient.  I called patient since she was a patient of Dr. French Ana McLean-Scocuzza and has not scheduled a TOC appointment since Dr. Judie Grieve left.  Patient states she did not realize that Dr. French Ana McLean-Scocuzza left our practice.  Patient states she is on vacation right now, but will call when she gets home to let us know if she is planning to stay with [facility] or seek primary care elsewhere.  I did let patient know that we will not be able to refill her prescription until she has an appointment scheduled with [place].

## 2022-10-10 ENCOUNTER — Encounter: Payer: Self-pay | Admitting: Family Medicine

## 2022-10-10 ENCOUNTER — Ambulatory Visit: Payer: Managed Care, Other (non HMO) | Admitting: Family Medicine

## 2022-10-10 VITALS — BP 97/63 | HR 88 | Ht 64.0 in | Wt 178.0 lb

## 2022-10-10 DIAGNOSIS — Z13220 Encounter for screening for lipoid disorders: Secondary | ICD-10-CM

## 2022-10-10 DIAGNOSIS — I10 Essential (primary) hypertension: Secondary | ICD-10-CM

## 2022-10-10 DIAGNOSIS — Z23 Encounter for immunization: Secondary | ICD-10-CM | POA: Diagnosis not present

## 2022-10-10 DIAGNOSIS — I509 Heart failure, unspecified: Secondary | ICD-10-CM | POA: Diagnosis not present

## 2022-10-10 DIAGNOSIS — J309 Allergic rhinitis, unspecified: Secondary | ICD-10-CM | POA: Diagnosis not present

## 2022-10-10 DIAGNOSIS — F32A Depression, unspecified: Secondary | ICD-10-CM

## 2022-10-10 DIAGNOSIS — F339 Major depressive disorder, recurrent, unspecified: Secondary | ICD-10-CM

## 2022-10-10 DIAGNOSIS — Z131 Encounter for screening for diabetes mellitus: Secondary | ICD-10-CM

## 2022-10-10 DIAGNOSIS — Z1231 Encounter for screening mammogram for malignant neoplasm of breast: Secondary | ICD-10-CM

## 2022-10-10 DIAGNOSIS — Z1329 Encounter for screening for other suspected endocrine disorder: Secondary | ICD-10-CM

## 2022-10-10 DIAGNOSIS — E785 Hyperlipidemia, unspecified: Secondary | ICD-10-CM

## 2022-10-10 DIAGNOSIS — Z1159 Encounter for screening for other viral diseases: Secondary | ICD-10-CM

## 2022-10-10 DIAGNOSIS — Z114 Encounter for screening for human immunodeficiency virus [HIV]: Secondary | ICD-10-CM

## 2022-10-10 MED ORDER — BUPROPION HCL ER (SR) 100 MG PO TB12
100.0000 mg | ORAL_TABLET | Freq: Two times a day (BID) | ORAL | 3 refills | Status: DC
Start: 2022-10-10 — End: 2023-12-01

## 2022-10-10 MED ORDER — OLMESARTAN MEDOXOMIL-HCTZ 20-12.5 MG PO TABS
1.0000 | ORAL_TABLET | Freq: Every day | ORAL | 3 refills | Status: DC
Start: 2022-10-10 — End: 2023-12-03

## 2022-10-10 MED ORDER — MONTELUKAST SODIUM 10 MG PO TABS: 10.0000 mg | ORAL_TABLET | Freq: Every day | ORAL | 3 refills | Status: DC

## 2022-10-10 MED ORDER — DULOXETINE HCL 60 MG PO CPEP: 60.0000 mg | ORAL_CAPSULE | Freq: Every day | ORAL | 3 refills | Status: DC

## 2022-10-10 MED ORDER — DULOXETINE HCL 30 MG PO CPEP: 30.0000 mg | ORAL_CAPSULE | Freq: Every day | ORAL | 3 refills | Status: DC

## 2022-10-10 MED ORDER — ATORVASTATIN CALCIUM 10 MG PO TABS: 10.0000 mg | ORAL_TABLET | Freq: Every day | ORAL | 3 refills | Status: DC

## 2022-10-10 NOTE — Progress Notes (Signed)
New Patient Office Visit   Subjective   Patient ID: OKLA QAZI, female    DOB: 01/25/1966  Age: 57 y.o. MRN: [id number]  CC:  Chief Complaint  Patient presents with   Establish Care    HPI Melanie Ballard 57 year old female, presents to establish care. She  has a past medical history of Allergy, Depression, History of blood transfusion, History of chickenpox, HLD (hyperlipidemia), Hyperparathyroidism (HCC), Kidney stone, Kidney stones, Migraines, and UTI (urinary tract infection).  Patient here for hypertension management She is not exercising and is adherent to low salt diet.  Blood pressure is well controlled at home. Patient denies Cardiac symptoms chest pain, chest pressure/discomfort, dyspnea, fatigue, palpitations, syncope, and tachypnea. Cardiovascular risk factors: dyslipidemia, family history of premature cardiovascular disease, hypertension, obesity (BMI >= 30 kg/m2), and sedentary lifestyle.    The 10-year ASCVD risk score (Arnett DK, et al., 2019) is: 1.4%   Values used to calculate the score:     Age: 62 years     Sex: Female     Is Non-Hispanic African American: No     Diabetic: No     Tobacco smoker: No     Systolic Blood Pressure: 97 mmHg     Is BP treated: Yes     HDL Cholesterol: 64.4 mg/dL     Total Cholesterol: 192 mg/dL   Outpatient Encounter Medications as of 10/10/2022  Medication Sig   amLODipine (NORVASC) 2.5 MG tablet Take 1 tablet (2.5 mg total) by mouth daily. In am dc 5 mg   atorvastatin (LIPITOR) 10 MG tablet Take 1 tablet (10 mg total) by mouth daily at 6 PM.   buPROPion ER (WELLBUTRIN SR) 100 MG 12 hr tablet Take 1 tablet (100 mg total) by mouth 2 (two) times daily.   DULoxetine (CYMBALTA) 30 MG capsule Take 1 capsule (30 mg total) by mouth daily. +60=90 mg qd   DULoxetine (CYMBALTA) 60 MG capsule Take 1 capsule (60 mg total) by mouth daily. +30=90 mg daily   fluticasone (FLONASE) 50 MCG/ACT nasal spray Place 2 sprays into both nostrils  daily. As needed   MELATONIN PO Take by mouth.   montelukast (SINGULAIR) 10 MG tablet Take 1 tablet (10 mg total) by mouth at bedtime.   olmesartan-hydrochlorothiazide (BENICAR HCT) 20-12.5 MG tablet Take 1 tablet by mouth daily. D/c hctz 12.5 alone. Take in the In am   nitrofurantoin, macrocrystal-monohydrate, (MACROBID) 100 MG capsule Take 1 capsule (100 mg total) by mouth 2 (two) times daily. With food   No facility-administered encounter medications on file as of 10/10/2022.    Past Surgical History:  Procedure Laterality Date   ABDOMINAL HYSTERECTOMY     reason DUB; no h/o abnormal Pap. Dr. Leslye Peer OB/GYN still has ovaries and FT intact not cervix    CESAREAN SECTION     1994   CHOLECYSTECTOMY     1993   CYSTOSCOPY WITH STENT PLACEMENT Left 09/12/2016   Procedure: CYSTOSCOPY WITH STENT PLACEMENT;  Surgeon: Hildred Laser, MD;  Location: [facility] ORS;  Service: Urology;  Laterality: Left;   KIDNEY STONE SURGERY     MENISCUS REPAIR     b/l knees    PARATHYROIDECTOMY     right sided parathyroid adenoma now s/p parathyroidectomy on 05/29/18 Dr. Casilda Carls   thermal hydroablation     DUB   TONSILLECTOMY     URETEROSCOPY WITH HOLMIUM LASER LITHOTRIPSY Left 09/12/2016   Procedure: URETEROSCOPY WITH HOLMIUM LASER LITHOTRIPSY;  Surgeon:  Hildred Laser, MD;  Location: [facility] ORS;  Service: Urology;  Laterality: Left;    Review of Systems  Constitutional:  Negative for chills and fever.  Respiratory:  Negative for shortness of breath.   Cardiovascular:  Negative for chest pain.  Gastrointestinal:  Negative for nausea and vomiting.  Genitourinary:  Negative for dysuria.  Musculoskeletal:  Negative for myalgias.  Skin:  Negative for rash.  Neurological:  Negative for dizziness and headaches.  Psychiatric/Behavioral:  Negative for depression. The patient is not nervous/anxious.       Objective    BP 97/63   Pulse 88   Ht 5\' 4"  (1.626 m)   Wt 178 lb (80.7 kg)   SpO2 95%   BMI 30.55  kg/m   Physical Exam Vitals reviewed.  Constitutional:      General: She is not in acute distress.    Appearance: Normal appearance. She is not ill-appearing, toxic-appearing or diaphoretic.  HENT:     Head: Normocephalic.     Right Ear: Tympanic membrane normal.     Left Ear: Tympanic membrane normal.     Nose: Nose normal.     Mouth/Throat:     Mouth: Mucous membranes are moist.  Eyes:     General:        Right eye: No discharge.        Left eye: No discharge.     Extraocular Movements: Extraocular movements intact.     Conjunctiva/sclera: Conjunctivae normal.     Pupils: Pupils are equal, round, and reactive to light.  Cardiovascular:     Rate and Rhythm: Normal rate.     Pulses: Normal pulses.     Heart sounds: Normal heart sounds.  Pulmonary:     Effort: Pulmonary effort is normal. No respiratory distress.     Breath sounds: Normal breath sounds.  Abdominal:     General: Bowel sounds are normal.     Palpations: Abdomen is soft.     Tenderness: There is no abdominal tenderness. There is no right CVA tenderness, left CVA tenderness or guarding.  Musculoskeletal:        General: Normal range of motion.     Cervical back: Normal range of motion.  Skin:    General: Skin is warm and dry.     Capillary Refill: Capillary refill takes less than 2 seconds.  Neurological:     General: No focal deficit present.     Mental Status: She is alert and oriented to person, place, and time.     Coordination: Coordination normal.     Gait: Gait normal.  Psychiatric:        Mood and Affect: Mood normal.        Behavior: Behavior normal.       Assessment & Plan:  Primary hypertension Assessment & Plan: Vitals:   10/10/22 0809  BP: 97/63   Blood pressure controlled in todays visit Ccontinue Olmesartan-HCTZ 20-12.5 mg daily Trial off amlodipine 2.5 mg Record at home blood pressures and follow up in 1-2 weeks.      Explained non pharmacological interventions such as low salt,  DASH diet discussed. Educated on stress reduction and physical activity minimum 150 minutes per week. Discussed signs and symptoms of major cardiovascular event and need to present to the ED.  Patient verbalizes understanding regarding plan of care and all questions answered.   Orders: -     CBC with Differential/Platelet -     CMP14+EGFR -  Microalbumin / creatinine urine ratio  Screening for HIV (human immunodeficiency virus) -     HIV Antibody (routine testing w rflx)  Need for hepatitis C screening test -     Hepatitis C antibody  Screening for thyroid disorder -     TSH + free T4  Screening for diabetes mellitus -     Hemoglobin A1c  Screening for lipid disorders -     Lipid panel  Breast cancer screening by mammogram -     Digital Screening Mammogram, Left and Right; Future  Congestive heart failure, unspecified HF chronicity, unspecified heart failure type (HCC) -     Brain natriuretic peptide    Return in about 4 months (around 02/10/2023) for chronic follow-up, hypertension.   Cruzita Lederer Newman Nip, FNP

## 2022-10-10 NOTE — Patient Instructions (Signed)

## 2022-10-10 NOTE — Assessment & Plan Note (Addendum)
Vitals:   10/10/22 0809  BP: 97/63   Blood pressure controlled in todays visit Ccontinue Olmesartan-HCTZ 20-12.5 mg daily Trial off amlodipine 2.5 mg Record at home blood pressures and follow up in 1-2 weeks.      Explained non pharmacological interventions such as low salt, DASH diet discussed. Educated on stress reduction and physical activity minimum 150 minutes per week. Discussed signs and symptoms of major cardiovascular event and need to present to the ED.  Patient verbalizes understanding regarding plan of care and all questions answered.

## 2022-10-11 ENCOUNTER — Other Ambulatory Visit: Payer: Self-pay | Admitting: Family Medicine

## 2022-10-11 DIAGNOSIS — R748 Abnormal levels of other serum enzymes: Secondary | ICD-10-CM

## 2022-10-11 LAB — BRAIN NATRIURETIC PEPTIDE

## 2022-10-12 LAB — CBC WITH DIFFERENTIAL/PLATELET
Basophils Absolute: 0.1 10*3/uL (ref 0.0–0.2)
Basos: 1 %
EOS (ABSOLUTE): 0.1 10*3/uL (ref 0.0–0.4)
Eos: 2 %
Hematocrit: 39 % (ref 34.0–46.6)
Hemoglobin: 12.9 g/dL (ref 11.1–15.9)
Immature Grans (Abs): 0 10*3/uL (ref 0.0–0.1)
Immature Granulocytes: 0 %
Lymphocytes Absolute: 1.8 10*3/uL (ref 0.7–3.1)
Lymphs: 30 %
MCH: 30.6 pg (ref 26.6–33.0)
MCHC: 33.1 g/dL (ref 31.5–35.7)
MCV: 92 fL (ref 79–97)
Monocytes Absolute: 0.3 10*3/uL (ref 0.1–0.9)
Monocytes: 6 %
Neutrophils Absolute: 3.8 10*3/uL (ref 1.4–7.0)
Neutrophils: 61 %
Platelets: 280 10*3/uL (ref 150–450)
RBC: 4.22 x10E6/uL (ref 3.77–5.28)
RDW: 13.4 % (ref 11.7–15.4)
WBC: 6.1 10*3/uL (ref 3.4–10.8)

## 2022-10-12 LAB — TSH+FREE T4
Free T4: 1.07 ng/dL (ref 0.82–1.77)
TSH: 1.88 u[IU]/mL (ref 0.450–4.500)

## 2022-10-12 LAB — HIV ANTIBODY (ROUTINE TESTING W REFLEX): HIV Screen 4th Generation wRfx: NONREACTIVE

## 2022-10-12 LAB — CMP14+EGFR
ALT: 17 IU/L (ref 0–32)
AST: 20 IU/L (ref 0–40)
Albumin/Globulin Ratio: 2.1 (ref 1.2–2.2)
Albumin: 4.8 g/dL (ref 3.8–4.9)
Alkaline Phosphatase: 160 IU/L — ABNORMAL HIGH (ref 44–121)
BUN/Creatinine Ratio: 20 (ref 9–23)
BUN: 30 mg/dL — ABNORMAL HIGH (ref 6–24)
Bilirubin Total: 0.3 mg/dL (ref 0.0–1.2)
CO2: 21 mmol/L (ref 20–29)
Calcium: 9.9 mg/dL (ref 8.7–10.2)
Chloride: 99 mmol/L (ref 96–106)
Creatinine, Ser: 1.47 mg/dL — ABNORMAL HIGH (ref 0.57–1.00)
Globulin, Total: 2.3 g/dL (ref 1.5–4.5)
Glucose: 101 mg/dL — ABNORMAL HIGH (ref 70–99)
Potassium: 4.4 mmol/L (ref 3.5–5.2)
Sodium: 140 mmol/L (ref 134–144)
Total Protein: 7.1 g/dL (ref 6.0–8.5)
eGFR: 42 mL/min/{1.73_m2} — ABNORMAL LOW (ref >59)

## 2022-10-12 LAB — MICROALBUMIN / CREATININE URINE RATIO
Creatinine, Urine: 116.2 mg/dL
Microalb/Creat Ratio: 4 mg/g creat (ref 0–29)
Microalbumin, Urine: 4.9 ug/mL

## 2022-10-12 LAB — LIPID PANEL
Chol/HDL Ratio: 3.9 ratio (ref 0.0–4.4)
Cholesterol, Total: 229 mg/dL — ABNORMAL HIGH (ref 100–199)
HDL: 59 mg/dL (ref >39)
LDL Chol Calc (NIH): 146 mg/dL — ABNORMAL HIGH (ref 0–99)
Triglycerides: 133 mg/dL (ref 0–149)
VLDL Cholesterol Cal: 24 mg/dL (ref 5–40)

## 2022-10-12 LAB — HEMOGLOBIN A1C
Est. average glucose Bld gHb Est-mCnc: 120 mg/dL
Hgb A1c MFr Bld: 5.8 % — ABNORMAL HIGH (ref 4.8–5.6)

## 2022-10-12 LAB — HEPATITIS C ANTIBODY: Hep C Virus Ab: NONREACTIVE

## 2022-10-17 ENCOUNTER — Encounter: Payer: Self-pay | Admitting: Family Medicine

## 2022-10-18 ENCOUNTER — Encounter (HOSPITAL_COMMUNITY): Payer: Self-pay

## 2022-10-18 ENCOUNTER — Ambulatory Visit (HOSPITAL_COMMUNITY)
Admission: RE | Admit: 2022-10-18 | Discharge: 2022-10-18 | Disposition: A | Discharge status: Home / Self Care | Payer: Managed Care, Other (non HMO) | Source: Ambulatory Visit | Attending: Family Medicine | Admitting: Family Medicine

## 2022-10-18 DIAGNOSIS — Z1231 Encounter for screening mammogram for malignant neoplasm of breast: Secondary | ICD-10-CM

## 2022-10-25 NOTE — Addendum Note (Signed)
Addended by: Rica Records on: 10/25/2022 05:34 PM   Modules accepted: Level of Service

## 2023-02-25 ENCOUNTER — Ambulatory Visit: Payer: Managed Care, Other (non HMO) | Admitting: Family Medicine

## 2023-11-29 ENCOUNTER — Other Ambulatory Visit: Payer: Self-pay | Admitting: Family Medicine

## 2023-11-29 DIAGNOSIS — J309 Allergic rhinitis, unspecified: Secondary | ICD-10-CM

## 2023-11-29 DIAGNOSIS — F339 Major depressive disorder, recurrent, unspecified: Secondary | ICD-10-CM

## 2023-11-29 DIAGNOSIS — E785 Hyperlipidemia, unspecified: Secondary | ICD-10-CM

## 2023-12-03 ENCOUNTER — Ambulatory Visit (INDEPENDENT_AMBULATORY_CARE_PROVIDER_SITE_OTHER): Payer: Self-pay

## 2023-12-03 VITALS — BP 110/74 | HR 85 | Ht 64.0 in | Wt 160.0 lb

## 2023-12-03 DIAGNOSIS — N951 Menopausal and female climacteric states: Secondary | ICD-10-CM

## 2023-12-03 DIAGNOSIS — R748 Abnormal levels of other serum enzymes: Secondary | ICD-10-CM | POA: Diagnosis not present

## 2023-12-03 DIAGNOSIS — I10 Essential (primary) hypertension: Secondary | ICD-10-CM

## 2023-12-03 MED ORDER — VEOZAH 45 MG PO TABS
1.0000 | ORAL_TABLET | Freq: Every day | ORAL | 5 refills | Status: AC
Start: 2023-12-03 — End: ?

## 2023-12-03 NOTE — Progress Notes (Signed)
   Established Patient Office Visit  Subjective   Patient ID: Melanie Ballard, female    DOB: 11-11-1965  Age: 58 y.o. MRN: [id number]  Chief Complaint  Patient presents with   Medical Management of Chronic Issues    Pt here to talk about her bp     HPI  Patient Active Problem List   Diagnosis Date Noted   Iron deficiency 10/01/2021   Vitamin D  deficiency 10/01/2021   B12 deficiency 10/01/2021   Elevated liver enzymes 03/07/2020   Elevated liver function tests 08/19/2019   Alkaline phosphatase elevation 05/12/2019   ANA positive 05/12/2019   Annual physical exam 01/06/2019   HTN (hypertension) 07/08/2018   Hyperlipidemia 07/08/2018   Hyperparathyroidism (HCC) 04/29/2018   Allergic rhinitis 02/05/2018   Anxiety and depression 12/23/2017   Obesity (BMI 30.0-34.9) 12/23/2017      ROS    Objective:     BP 110/74   Pulse 85   Ht 5' 4 (1.626 m)   Wt 160 lb (72.6 kg)   SpO2 97%   BMI 27.46 kg/m  BP Readings from Last 3 Encounters:  12/03/23 110/74  10/10/22 97/63  09/28/21 (!) 140/100   Wt Readings from Last 3 Encounters:  12/03/23 160 lb (72.6 kg)  10/10/22 178 lb (80.7 kg)  09/28/21 185 lb 3.2 oz (84 kg)     Physical Exam Vitals and nursing note reviewed.  Constitutional:      Appearance: Normal appearance.   Eyes:     Extraocular Movements: Extraocular movements intact.     Pupils: Pupils are equal, round, and reactive to light.    Cardiovascular:     Rate and Rhythm: Normal rate and regular rhythm.  Pulmonary:     Effort: Pulmonary effort is normal.     Breath sounds: Normal breath sounds.   Musculoskeletal:     Cervical back: Normal range of motion and neck supple.   Neurological:     Mental Status: She is alert and oriented to person, place, and time.   Psychiatric:        Mood and Affect: Mood normal.        Thought Content: Thought content normal.       The 10-year ASCVD risk score (Arnett DK, et al., 2019) is: 1.9%     Assessment & Plan:   Problem List Items Addressed This Visit       Cardiovascular and Mediastinum   HTN (hypertension) - Primary   Vitals:   12/03/23 1440  BP: 110/74   Blood pressure controlled in todays visit Ccontinue Olmesartan -HCTZ 20-12.5 mg daily Follow-up in 6 months or sooner if home blood pressures are above 130/80.      Relevant Orders   CMP14+EGFR (Completed)     Other   Alkaline phosphatase elevation   Recheck labs for revaluation.  F/U according to lab results.       Relevant Orders   CMP14+EGFR (Completed)   Other Visit Diagnoses       Vasomotor symptoms due to menopause       add Veozah  for symptoms of hot flashes.   Relevant Medications   Fezolinetant  (VEOZAH ) 45 MG TABS       No follow-ups on file.    Leita Longs, FNP

## 2023-12-04 ENCOUNTER — Other Ambulatory Visit (HOSPITAL_COMMUNITY): Payer: Self-pay

## 2023-12-04 LAB — CMP14+EGFR
ALT: 17 IU/L (ref 0–32)
AST: 19 IU/L (ref 0–40)
Albumin: 4.5 g/dL (ref 3.8–4.9)
Alkaline Phosphatase: 115 IU/L (ref 44–121)
BUN/Creatinine Ratio: 23 (ref 9–23)
BUN: 31 mg/dL — ABNORMAL HIGH (ref 6–24)
Bilirubin Total: 0.4 mg/dL (ref 0.0–1.2)
CO2: 22 mmol/L (ref 20–29)
Calcium: 9.2 mg/dL (ref 8.7–10.2)
Chloride: 103 mmol/L (ref 96–106)
Creatinine, Ser: 1.33 mg/dL — ABNORMAL HIGH (ref 0.57–1.00)
Globulin, Total: 1.9 g/dL (ref 1.5–4.5)
Glucose: 89 mg/dL (ref 70–99)
Potassium: 4.7 mmol/L (ref 3.5–5.2)
Sodium: 140 mmol/L (ref 134–144)
Total Protein: 6.4 g/dL (ref 6.0–8.5)
eGFR: 47 mL/min/{1.73_m2} — ABNORMAL LOW (ref >59)

## 2023-12-07 NOTE — Assessment & Plan Note (Signed)
 Recheck labs for revaluation.  F/U according to lab results.

## 2023-12-07 NOTE — Assessment & Plan Note (Signed)
 Vitals:   12/03/23 1440  BP: 110/74   Blood pressure controlled in todays visit Ccontinue Olmesartan -HCTZ 20-12.5 mg daily Follow-up in 6 months or sooner if home blood pressures are above 130/80.

## 2023-12-08 ENCOUNTER — Telehealth: Payer: Self-pay | Admitting: Pharmacy Technician

## 2023-12-08 NOTE — Telephone Encounter (Signed)
 Pharmacy Patient Advocate Encounter   Received notification from CoverMyMeds that prior authorization for Veozah  45MG  tablets is required/requested.   Insurance verification completed.   The patient is insured through [employer] .   Per test claim: PA required; PA submitted to above mentioned insurance via CoverMyMeds Key/confirmation #/EOC [facility] Status is pending

## 2023-12-11 ENCOUNTER — Other Ambulatory Visit (HOSPITAL_COMMUNITY): Payer: Self-pay

## 2023-12-15 NOTE — Telephone Encounter (Signed)
 Pharmacy Patient Advocate Encounter  Received notification from [employer] that Prior Authorization for Veozah  45MG  tablets  has been DENIED.  Full denial letter will be uploaded to the media tab. See denial reason below.   PA #/Case ID/Reference #: [id number]

## 2023-12-28 ENCOUNTER — Ambulatory Visit: Payer: Self-pay

## 2024-01-16 ENCOUNTER — Encounter: Payer: Self-pay | Admitting: Family Medicine

## 2024-04-27 ENCOUNTER — Other Ambulatory Visit: Payer: Self-pay | Admitting: Family Medicine

## 2024-04-27 DIAGNOSIS — F32A Depression, unspecified: Secondary | ICD-10-CM

## 2024-04-30 ENCOUNTER — Telehealth: Payer: Self-pay | Admitting: Family Medicine

## 2024-04-30 NOTE — Telephone Encounter (Unsigned)
 Copied from CRM #[id number]. Topic: Clinical - Prescription Issue >> Apr 30, 2024 12:57 PM Avram MATSU wrote: Reason for CRM: patient was approved for both 30mg  and 60mg , stated she have not gotten the medication for DULoxetine  (CYMBALTA ) 30 MG capsule [[id number]]

## 2024-05-05 ENCOUNTER — Other Ambulatory Visit: Payer: Self-pay

## 2024-05-05 DIAGNOSIS — F32A Depression, unspecified: Secondary | ICD-10-CM

## 2024-05-05 MED ORDER — DULOXETINE HCL 30 MG PO CPEP
30.0000 mg | ORAL_CAPSULE | Freq: Every day | ORAL | 3 refills | Status: AC
Start: 2024-05-05 — End: ?
